# Patient Record
Sex: Female | Born: 1935 | Race: White | Hispanic: No
Health system: Southern US, Community
[De-identification: ages and names within clinical notes are randomized; demographics above are authoritative.]

## PROBLEM LIST (undated history)

## (undated) DIAGNOSIS — M1990 Unspecified osteoarthritis, unspecified site: Secondary | ICD-10-CM

## (undated) DIAGNOSIS — I10 Essential (primary) hypertension: Secondary | ICD-10-CM

## (undated) DIAGNOSIS — I351 Nonrheumatic aortic (valve) insufficiency: Secondary | ICD-10-CM

## (undated) DIAGNOSIS — R159 Full incontinence of feces: Secondary | ICD-10-CM

## (undated) DIAGNOSIS — N3281 Overactive bladder: Secondary | ICD-10-CM

## (undated) DIAGNOSIS — Z0389 Encounter for observation for other suspected diseases and conditions ruled out: Secondary | ICD-10-CM

## (undated) DIAGNOSIS — K5730 Diverticulosis of large intestine without perforation or abscess without bleeding: Secondary | ICD-10-CM

## (undated) DIAGNOSIS — F329 Major depressive disorder, single episode, unspecified: Secondary | ICD-10-CM

## (undated) DIAGNOSIS — E041 Nontoxic single thyroid nodule: Secondary | ICD-10-CM

## (undated) DIAGNOSIS — I5189 Other ill-defined heart diseases: Secondary | ICD-10-CM

## (undated) DIAGNOSIS — I420 Dilated cardiomyopathy: Secondary | ICD-10-CM

## (undated) DIAGNOSIS — E890 Postprocedural hypothyroidism: Secondary | ICD-10-CM

## (undated) DIAGNOSIS — J302 Other seasonal allergic rhinitis: Secondary | ICD-10-CM

## (undated) DIAGNOSIS — S82899A Other fracture of unspecified lower leg, initial encounter for closed fracture: Secondary | ICD-10-CM

## (undated) DIAGNOSIS — K529 Noninfective gastroenteritis and colitis, unspecified: Secondary | ICD-10-CM

## (undated) DIAGNOSIS — E785 Hyperlipidemia, unspecified: Secondary | ICD-10-CM

## (undated) DIAGNOSIS — F32A Depression, unspecified: Secondary | ICD-10-CM

## (undated) DIAGNOSIS — K219 Gastro-esophageal reflux disease without esophagitis: Secondary | ICD-10-CM

## (undated) DIAGNOSIS — R911 Solitary pulmonary nodule: Secondary | ICD-10-CM

## (undated) HISTORY — DX: Depression, unspecified: F32.A

## (undated) HISTORY — DX: Dilated cardiomyopathy: I42.0

## (undated) HISTORY — PX: TONSILLECTOMY: SUR1361

## (undated) HISTORY — DX: Solitary pulmonary nodule: R91.1

## (undated) HISTORY — DX: Other fracture of unspecified lower leg, initial encounter for closed fracture: S82.899A

## (undated) HISTORY — DX: Nonrheumatic aortic (valve) insufficiency: I35.1

## (undated) HISTORY — DX: Major depressive disorder, single episode, unspecified: F32.9

## (undated) HISTORY — PX: NASAL SINUS SURGERY: SHX719

## (undated) HISTORY — PX: OTHER SURGICAL HISTORY: SHX169

## (undated) HISTORY — DX: Hyperlipidemia, unspecified: E78.5

## (undated) HISTORY — DX: Other ill-defined heart diseases: I51.89

## (undated) HISTORY — DX: Diverticulosis of large intestine without perforation or abscess without bleeding: K57.30

## (undated) HISTORY — DX: Postprocedural hypothyroidism: E89.0

## (undated) HISTORY — PX: CARDIAC DEFIBRILLATOR PLACEMENT: SHX171

## (undated) HISTORY — DX: Other seasonal allergic rhinitis: J30.2

## (undated) HISTORY — DX: Gastro-esophageal reflux disease without esophagitis: K21.9

## (undated) HISTORY — DX: Encounter for observation for other suspected diseases and conditions ruled out: Z03.89

## (undated) HISTORY — DX: Full incontinence of feces: R15.9

## (undated) HISTORY — PX: ABDOMINAL HYSTERECTOMY: SHX81

## (undated) HISTORY — DX: Overactive bladder: N32.81

## (undated) HISTORY — DX: Nontoxic single thyroid nodule: E04.1

## (undated) HISTORY — DX: Essential (primary) hypertension: I10

## (undated) HISTORY — DX: Noninfective gastroenteritis and colitis, unspecified: K52.9

## (undated) HISTORY — DX: Unspecified osteoarthritis, unspecified site: M19.90

---
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
## 1985-06-05 HISTORY — PX: NOSE SURGERY: SHX723

## 1986-06-05 HISTORY — PX: TOTAL ABDOMINAL HYSTERECTOMY W/ BILATERAL SALPINGOOPHORECTOMY: SHX83

## 1999-08-04 HISTORY — PX: OTHER SURGICAL HISTORY: SHX169

## 2001-02-14 ENCOUNTER — Ambulatory Visit (HOSPITAL_COMMUNITY): Admission: RE | Admit: 2001-02-14 | Discharge: 2001-02-14 | Payer: Self-pay | Admitting: *Deleted

## 2001-07-05 ENCOUNTER — Encounter: Admission: RE | Admit: 2001-07-05 | Discharge: 2001-07-05 | Payer: Self-pay

## 2001-07-05 ENCOUNTER — Encounter: Payer: Self-pay | Admitting: *Deleted

## 2002-01-03 HISTORY — PX: KNEE SURGERY: SHX244

## 2004-06-05 DIAGNOSIS — IMO0001 Reserved for inherently not codable concepts without codable children: Secondary | ICD-10-CM

## 2004-06-05 HISTORY — DX: Reserved for inherently not codable concepts without codable children: IMO0001

## 2004-07-12 ENCOUNTER — Encounter: Admission: RE | Admit: 2004-07-12 | Discharge: 2004-07-12 | Payer: Self-pay | Admitting: Cardiology

## 2004-07-13 ENCOUNTER — Ambulatory Visit (HOSPITAL_COMMUNITY): Admission: RE | Admit: 2004-07-13 | Discharge: 2004-07-14 | Payer: Self-pay | Admitting: Cardiology

## 2004-10-18 ENCOUNTER — Ambulatory Visit: Payer: Self-pay | Admitting: Internal Medicine

## 2004-11-08 ENCOUNTER — Ambulatory Visit (HOSPITAL_COMMUNITY): Admission: RE | Admit: 2004-11-08 | Discharge: 2004-11-09 | Payer: Self-pay | Admitting: Cardiology

## 2004-11-08 ENCOUNTER — Ambulatory Visit: Payer: Self-pay | Admitting: Internal Medicine

## 2004-11-23 ENCOUNTER — Emergency Department (HOSPITAL_COMMUNITY): Admission: EM | Admit: 2004-11-23 | Discharge: 2004-11-24 | Payer: Self-pay | Admitting: Emergency Medicine

## 2005-02-28 ENCOUNTER — Encounter: Admission: RE | Admit: 2005-02-28 | Discharge: 2005-02-28 | Payer: Self-pay | Admitting: Cardiology

## 2005-03-13 ENCOUNTER — Inpatient Hospital Stay (HOSPITAL_COMMUNITY): Admission: RE | Admit: 2005-03-13 | Discharge: 2005-03-13 | Payer: Self-pay | Admitting: Cardiology

## 2005-08-07 ENCOUNTER — Encounter: Admission: RE | Admit: 2005-08-07 | Discharge: 2005-08-07 | Payer: Self-pay | Admitting: Family Medicine

## 2005-12-08 ENCOUNTER — Encounter (INDEPENDENT_AMBULATORY_CARE_PROVIDER_SITE_OTHER): Payer: Self-pay | Admitting: Specialist

## 2005-12-08 ENCOUNTER — Ambulatory Visit (HOSPITAL_COMMUNITY): Admission: RE | Admit: 2005-12-08 | Discharge: 2005-12-08 | Payer: Self-pay | Admitting: Gastroenterology

## 2005-12-21 ENCOUNTER — Encounter: Admission: RE | Admit: 2005-12-21 | Discharge: 2005-12-21 | Payer: Self-pay | Admitting: Gastroenterology

## 2006-01-08 ENCOUNTER — Encounter: Admission: RE | Admit: 2006-01-08 | Discharge: 2006-01-08 | Payer: Self-pay | Admitting: Gastroenterology

## 2006-02-02 ENCOUNTER — Encounter: Admission: RE | Admit: 2006-02-02 | Discharge: 2006-02-02 | Payer: Self-pay | Admitting: Family Medicine

## 2006-03-20 ENCOUNTER — Ambulatory Visit (HOSPITAL_COMMUNITY): Admission: RE | Admit: 2006-03-20 | Discharge: 2006-03-20 | Payer: Self-pay | Admitting: Gastroenterology

## 2006-03-20 ENCOUNTER — Encounter (INDEPENDENT_AMBULATORY_CARE_PROVIDER_SITE_OTHER): Payer: Self-pay | Admitting: *Deleted

## 2006-08-12 ENCOUNTER — Inpatient Hospital Stay (HOSPITAL_COMMUNITY): Admission: EM | Admit: 2006-08-12 | Discharge: 2006-08-14 | Payer: Self-pay | Admitting: Emergency Medicine

## 2006-11-14 ENCOUNTER — Ambulatory Visit (HOSPITAL_COMMUNITY): Admission: RE | Admit: 2006-11-14 | Discharge: 2006-11-14 | Payer: Self-pay | Admitting: Orthopedic Surgery

## 2007-02-03 ENCOUNTER — Inpatient Hospital Stay (HOSPITAL_COMMUNITY): Admission: EM | Admit: 2007-02-03 | Discharge: 2007-02-07 | Payer: Self-pay | Admitting: Emergency Medicine

## 2007-02-03 ENCOUNTER — Ambulatory Visit: Payer: Self-pay | Admitting: Internal Medicine

## 2007-02-06 ENCOUNTER — Ambulatory Visit: Payer: Self-pay | Admitting: Thoracic Surgery (Cardiothoracic Vascular Surgery)

## 2007-02-08 ENCOUNTER — Encounter: Admission: RE | Admit: 2007-02-08 | Discharge: 2007-02-08 | Payer: Self-pay | Admitting: Cardiology

## 2007-02-20 ENCOUNTER — Ambulatory Visit: Payer: Self-pay

## 2007-09-03 ENCOUNTER — Ambulatory Visit: Payer: Self-pay | Admitting: Internal Medicine

## 2007-09-12 ENCOUNTER — Encounter: Payer: Self-pay | Admitting: Critical Care Medicine

## 2007-09-12 ENCOUNTER — Encounter: Admission: RE | Admit: 2007-09-12 | Discharge: 2007-09-12 | Payer: Self-pay | Admitting: Cardiology

## 2007-10-03 ENCOUNTER — Other Ambulatory Visit: Admission: RE | Admit: 2007-10-03 | Discharge: 2007-10-03 | Payer: Self-pay | Admitting: Family Medicine

## 2007-10-10 DIAGNOSIS — I509 Heart failure, unspecified: Secondary | ICD-10-CM | POA: Insufficient documentation

## 2007-10-10 DIAGNOSIS — I10 Essential (primary) hypertension: Secondary | ICD-10-CM

## 2007-10-11 ENCOUNTER — Ambulatory Visit: Payer: Self-pay | Admitting: Critical Care Medicine

## 2007-10-11 DIAGNOSIS — J984 Other disorders of lung: Secondary | ICD-10-CM

## 2007-10-15 ENCOUNTER — Ambulatory Visit (HOSPITAL_COMMUNITY): Admission: RE | Admit: 2007-10-15 | Discharge: 2007-10-15 | Payer: Self-pay | Admitting: Critical Care Medicine

## 2007-10-15 DIAGNOSIS — I4891 Unspecified atrial fibrillation: Secondary | ICD-10-CM

## 2007-10-21 ENCOUNTER — Telehealth (INDEPENDENT_AMBULATORY_CARE_PROVIDER_SITE_OTHER): Payer: Self-pay | Admitting: *Deleted

## 2007-10-22 ENCOUNTER — Encounter: Payer: Self-pay | Admitting: Critical Care Medicine

## 2007-10-23 ENCOUNTER — Encounter: Payer: Self-pay | Admitting: Critical Care Medicine

## 2007-10-23 ENCOUNTER — Ambulatory Visit: Payer: Self-pay | Admitting: Critical Care Medicine

## 2007-10-23 DIAGNOSIS — J309 Allergic rhinitis, unspecified: Secondary | ICD-10-CM

## 2008-02-13 ENCOUNTER — Ambulatory Visit: Payer: Self-pay | Admitting: Cardiology

## 2008-02-14 ENCOUNTER — Encounter: Payer: Self-pay | Admitting: Critical Care Medicine

## 2008-02-17 ENCOUNTER — Telehealth (INDEPENDENT_AMBULATORY_CARE_PROVIDER_SITE_OTHER): Payer: Self-pay | Admitting: *Deleted

## 2008-05-27 ENCOUNTER — Encounter: Admission: RE | Admit: 2008-05-27 | Discharge: 2008-05-27 | Payer: Self-pay | Admitting: Gastroenterology

## 2008-10-20 ENCOUNTER — Encounter: Admission: RE | Admit: 2008-10-20 | Discharge: 2008-10-20 | Payer: Self-pay | Admitting: Gastroenterology

## 2008-10-22 ENCOUNTER — Ambulatory Visit: Payer: Self-pay | Admitting: Internal Medicine

## 2008-10-22 DIAGNOSIS — M1480 Arthropathies in other specified diseases classified elsewhere, unspecified site: Secondary | ICD-10-CM

## 2008-11-04 ENCOUNTER — Other Ambulatory Visit: Admission: RE | Admit: 2008-11-04 | Discharge: 2008-11-04 | Payer: Self-pay | Admitting: Interventional Radiology

## 2008-11-04 ENCOUNTER — Encounter (INDEPENDENT_AMBULATORY_CARE_PROVIDER_SITE_OTHER): Payer: Self-pay | Admitting: Interventional Radiology

## 2008-11-04 ENCOUNTER — Encounter: Admission: RE | Admit: 2008-11-04 | Discharge: 2008-11-04 | Payer: Self-pay | Admitting: Family Medicine

## 2009-03-17 ENCOUNTER — Telehealth (INDEPENDENT_AMBULATORY_CARE_PROVIDER_SITE_OTHER): Payer: Self-pay | Admitting: *Deleted

## 2009-03-29 ENCOUNTER — Encounter (INDEPENDENT_AMBULATORY_CARE_PROVIDER_SITE_OTHER): Payer: Self-pay | Admitting: Surgery

## 2009-03-29 ENCOUNTER — Ambulatory Visit (HOSPITAL_COMMUNITY): Admission: RE | Admit: 2009-03-29 | Discharge: 2009-03-30 | Payer: Self-pay | Admitting: Surgery

## 2009-03-29 HISTORY — PX: THYROIDECTOMY: SHX17

## 2009-04-13 ENCOUNTER — Ambulatory Visit: Payer: Self-pay | Admitting: Critical Care Medicine

## 2009-04-14 ENCOUNTER — Encounter: Payer: Self-pay | Admitting: Critical Care Medicine

## 2009-05-18 ENCOUNTER — Encounter (HOSPITAL_COMMUNITY): Admission: RE | Admit: 2009-05-18 | Discharge: 2009-06-04 | Payer: Self-pay | Admitting: Internal Medicine

## 2009-10-13 ENCOUNTER — Telehealth: Payer: Self-pay | Admitting: Critical Care Medicine

## 2009-10-14 ENCOUNTER — Encounter: Admission: RE | Admit: 2009-10-14 | Discharge: 2009-10-14 | Payer: Self-pay | Admitting: Internal Medicine

## 2009-10-19 ENCOUNTER — Ambulatory Visit: Payer: Self-pay | Admitting: Critical Care Medicine

## 2010-05-23 ENCOUNTER — Encounter (HOSPITAL_COMMUNITY)
Admission: RE | Admit: 2010-05-23 | Discharge: 2010-07-05 | Payer: Self-pay | Source: Home / Self Care | Attending: Internal Medicine | Admitting: Internal Medicine

## 2010-06-26 ENCOUNTER — Encounter: Payer: Self-pay | Admitting: Gastroenterology

## 2010-07-05 NOTE — Progress Notes (Signed)
Summary: appt for nodule  Phone Note Outgoing Call   Reason for Call: Confirm/change Appt Summary of Call: Make sure this pt has a f/u appt for pulm nodule Initial call taken by: Storm Frisk MD,  Oct 13, 2009 5:35 PM  Follow-up for Phone Call        Pt has no pending appts with PW.  Spoke with her.  Informed her she was last seen by PW 04/2009 and was told to f/u in 6 months but no has pending appts.  She is ok with scheduling f/u with PW at this time.  OV made for 10/19/09 at 330 with PW - she is aware. Follow-up by: Gweneth Dimitri RN,  Oct 15, 2009 10:13 AM

## 2010-07-05 NOTE — Assessment & Plan Note (Signed)
Summary: Pulmonary OV   Copy to:  Dr. Armanda Magic Primary Provider/Referring Provider:  Dr. Foy Guadalajara  CC:  6 month follow up for pulmonary nodule.  Pt states breathing is the same-no better or worse.  States she has a tickle in back of throat that causes dry cough.  .  History of Present Illness:        This is a 75 years old female here for pulmonary f/u.   Also had PET scan for RLL pulm nodule that is neg for uptake.  The nodule is 7mm in diameter.  Last OV 10/11/07, now here for pulmonary f/u.   April 13, 2009 4:06 PM Pt here to discuss RLL nodule. HAd PET scan 2009 neg.  Nodule only 7mm in diam and in RLL. No new symptoms. Pt is ? if needs another CT  Oct 19, 2009 3:45 PM Since last ov had thyroidectomy for CA of thyroid.  Nodule not scanned since 2009,  neg cxr at last ov in 11/10.  No change in nodule since 2007 and neg PET scan seen.  No new lung symptoms.  Pt denies any significant sore throat, nasal congestion or excess secretions, fever, chills, sweats, unintended weight loss, pleurtic or exertional chest pain, orthopnea PND, or leg swelling Pt denies any increase in rescue therapy over baseline, denies waking up needing it or having any early am or nocturnal exacerbations of coughing/wheezing/or dyspnea. Pt also denies any obvious fluctuation in symptoms wiht weather or environmental change or other alleviating or aggravating factors   Preventive Screening-Counseling & Management  Alcohol-Tobacco     Smoking Status: never     Passive Smoke Exposure: yes  Current Medications (verified): 1)  Lipitor 20 Mg  Tabs (Atorvastatin Calcium) .... Take 1 Tablet By Mouth Once A Day 2)  Ramipril 5 Mg  Caps (Ramipril) .... 15mg  Daily 3)  Coreg 6.25 Mg  Tabs (Carvedilol) .... Take 1 Tablet By Mouth Two Times A Day 4)  Adult Aspirin Ec Low Strength 81 Mg  Tbec (Aspirin) .... Take 1 Tablet By Mouth Once A Day 5)  Enablex 15 Mg  Tb24 (Darifenacin Hydrobromide) .... Take 1 Tablet By Mouth  Once A Day 6)  Multivitamins   Tabs (Multiple Vitamin) .Marland Kitchen.. 1 By Mouth Daily 7)  Vitamin C 1000 Mg Tabs (Ascorbic Acid) .... Once Daily 8)  Omega-3 350 Mg Caps (Omega-3 Fatty Acids) .... Two Times A Day 9)  Kapidex 60 Mg Cpdr (Dexlansoprazole) .Marland Kitchen.. 1 By Mouth Once Daily  * On Hold * 10)  Astelin 137 Mcg/spray Soln (Azelastine Hcl) .... 2 Sprays Each Nostril Two Times A Day As Needed 11)  Tums 500 Mg Chew (Calcium Carbonate Antacid) .... 2 Tabs Two Times A Day 12)  Synthroid 125 Mcg Tabs (Levothyroxine Sodium) .... Once Daily 13)  Klor-Con 10 10 Meq Cr-Tabs (Potassium Chloride) .... Every Other Day  Allergies (verified): No Known Drug Allergies  Past History:  Past medical, surgical, family and social histories (including risk factors) reviewed, and no changes noted (except as noted below).  Past Medical History: Current Problems:  NON-ISCHEMIC CARDIOMYOPATHY (ICD-425.4) CONGESTIVE HEART FAILURE (ICD-428.0) Hx of ATRIAL FIBRILLATION (ICD-427.31) PULMONARY NODULE (ICD-518.89) DYSPNEA (ICD-786.05) HYPERTENSION (ICD-401.9) HYPERLIPIDEMIA (ICD-272.4) G E R D (ICD-530.81) ALLERGIC RHINITIS (ICD-477.9) Thyroid Cancer     -Resection 2010     -Postop XRT  Past Surgical History: s/p defibrillator placement times two last one 9/08    Status post removal of  old lead and insertion of new lead  on February 06, 2007 under the  care of Dr. Sharrell Ku.  bilat knee surgery 2003, then 2008 ankle surgery 2008 sinus surgery 1988 T A H and B S O Tonsillectomy Thyroidectomy 2010    -Thyroid CA  Family History: Reviewed history from 10/11/2007 and no changes required. Family History Coronary Heart Disease Family History COPD  Family History Lung Cancer Family History C V A / Stroke   Social History: Reviewed history from 10/10/2008 and no changes required.  Patient is divorced.  She denies any history of tobacco  use.  She has an occasional glass of wine.  She is retired.  She lives   in a 1-story house.Passive Smoke Exposure:  yes  Review of Systems       The patient complains of shortness of breath with activity.  The patient denies shortness of breath at rest, productive cough, non-productive cough, coughing up blood, chest pain, irregular heartbeats, acid heartburn, indigestion, loss of appetite, weight change, abdominal pain, difficulty swallowing, sore throat, tooth/dental problems, headaches, nasal congestion/difficulty breathing through nose, sneezing, itching, ear ache, anxiety, depression, hand/feet swelling, joint stiffness or pain, rash, change in color of mucus, and fever.    Vital Signs:  Patient profile:   75 year old female Height:      63 inches Weight:      174 pounds BMI:     30.93 O2 Sat:      95 % on Room air Temp:     98.3 degrees F oral Pulse rate:   74 / minute BP sitting:   118 / 80  (right arm) Cuff size:   regular  Vitals Entered By: Gweneth Dimitri RN (Oct 19, 2009 3:38 PM)  O2 Flow:  Room air CC: 6 month follow up for pulmonary nodule.  Pt states breathing is the same-no better or worse.  States she has a tickle in back of throat that causes dry cough.   Comments Medications reviewed with patient Daytime contact number verified with patient. Gweneth Dimitri RN  Oct 19, 2009 3:39 PM    Physical Exam  Additional Exam:  Gen: WD WN      WF    in NAD    NCAT Heent:  no jvd, no TMG, no cervical LNademopathy, orophyx clear,  nares with clear watery drainage. Cor: RRR nl s1/s2  no s3/s4  no m r h g Abd: soft NT BSA   no masses  No HSM  no rebound or guarding Ext perfused with no c v e v.d Neuro: intact, moves all 4s, CN II-XII intact, DTRs intact Chest:  no wheezes, rales, rhonchi   no egophony  no consolidative breath sounds Skin: clear  Genital/Rectal :deferred    Impression & Recommendations:  Problem # 1:  PULMONARY NODULE (ICD-518.89) Assessment Unchanged RLL non calcified lung nodule, unchanged since 2007.  No need for  additional CT scanning . plan f/u expectantly pulm f/u is as needed no further CT scanning needed Orders: Est. Patient Level III (25956)  Medications Added to Medication List This Visit: 1)  Vitamin C 1000 Mg Tabs (Ascorbic acid) .... Once daily 2)  Kapidex 60 Mg Cpdr (Dexlansoprazole) .Marland Kitchen.. 1 by mouth once daily  * on hold * 3)  Astelin 137 Mcg/spray Soln (Azelastine hcl) .... 2 sprays each nostril two times a day as needed 4)  Klor-con 10 10 Meq Cr-tabs (Potassium chloride) .... Every other day  Complete Medication List: 1)  Lipitor 20 Mg Tabs (Atorvastatin calcium) .... Take  1 tablet by mouth once a day 2)  Ramipril 5 Mg Caps (Ramipril) .... 15mg  daily 3)  Coreg 6.25 Mg Tabs (Carvedilol) .... Take 1 tablet by mouth two times a day 4)  Adult Aspirin Ec Low Strength 81 Mg Tbec (Aspirin) .... Take 1 tablet by mouth once a day 5)  Enablex 15 Mg Tb24 (Darifenacin hydrobromide) .... Take 1 tablet by mouth once a day 6)  Multivitamins Tabs (Multiple vitamin) .Marland Kitchen.. 1 by mouth daily 7)  Vitamin C 1000 Mg Tabs (Ascorbic acid) .... Once daily 8)  Omega-3 350 Mg Caps (Omega-3 fatty acids) .... Two times a day 9)  Astelin 137 Mcg/spray Soln (Azelastine hcl) .... 2 sprays each nostril two times a day as needed 10)  Tums 500 Mg Chew (Calcium carbonate antacid) .... 2 tabs two times a day 11)  Synthroid 125 Mcg Tabs (Levothyroxine sodium) .... Once daily 12)  Klor-con 10 10 Meq Cr-tabs (Potassium chloride) .... Every other day  Patient Instructions: 1)  No further CT scans needed  2)  Follow up as needed   Immunization History:  Influenza Immunization History:    Influenza:  historical (03/05/2009)  Pneumovax Immunization History:    Pneumovax:  historical (03/05/2008)   Appended Document: Pulmonary OV fax Recardo Evangelist, Armanda Magic

## 2010-08-15 LAB — MISCELLANEOUS TEST

## 2010-08-19 ENCOUNTER — Encounter (INDEPENDENT_AMBULATORY_CARE_PROVIDER_SITE_OTHER): Payer: Self-pay | Admitting: *Deleted

## 2010-08-23 ENCOUNTER — Other Ambulatory Visit: Payer: Self-pay | Admitting: Gastroenterology

## 2010-08-23 NOTE — Letter (Signed)
Summary: Appointment - Reminder 2  [facility], Main Office  [address]   [place], [place] [address]   Phone: [phone]  Fax: [phone]     August 19, 2010 MRN: [id number]   [facility] [address] [place], [place]  [address]   Dear Ms. Ates,  Our records indicate that it is time to schedule a follow-up appointment.  Dr.Taylor recommended that you follow up with [place] in May. It is very important that we reach you to schedule this appointment. We look forward to participating in your health care needs. Please contact us at the number listed above at your earliest convenience to schedule your appointment.  If you are unable to make an appointment at this time, give [place] a call so we can update our records.     Sincerely,   Glass blower/designer

## 2010-09-08 LAB — CALCIUM
Calcium: 8.7 mg/dL (ref 8.4–10.5)
Calcium: 9.6 mg/dL (ref 8.4–10.5)

## 2010-09-08 LAB — BASIC METABOLIC PANEL
CO2: 26 mEq/L (ref 19–32)
Calcium: 9.8 mg/dL (ref 8.4–10.5)
Chloride: 109 mEq/L (ref 96–112)
GFR calc Af Amer: >60 mL/min (ref >60)
Sodium: 142 mEq/L (ref 135–145)

## 2010-09-08 LAB — CBC
Hemoglobin: 14.4 g/dL (ref 12.0–15.0)
MCHC: 34.3 g/dL (ref 30.0–36.0)
MCV: 94.6 fL (ref 78.0–100.0)
RBC: 4.45 MIL/uL (ref 3.87–5.11)
WBC: 6.1 10*3/uL (ref 4.0–10.5)

## 2010-09-08 LAB — URINE MICROSCOPIC-ADD ON

## 2010-09-08 LAB — URINALYSIS, ROUTINE W REFLEX MICROSCOPIC
Glucose, UA: NEGATIVE mg/dL
Ketones, ur: NEGATIVE mg/dL
Specific Gravity, Urine: 1.022 (ref 1.005–1.030)
pH: 7.5 (ref 5.0–8.0)

## 2010-09-08 LAB — DIFFERENTIAL
Basophils Relative: 1 % (ref 0–1)
Lymphs Abs: 1.6 10*3/uL (ref 0.7–4.0)
Monocytes Absolute: 0.7 10*3/uL (ref 0.1–1.0)
Monocytes Relative: 11 % (ref 3–12)
Neutro Abs: 3.6 10*3/uL (ref 1.7–7.7)

## 2010-10-18 ENCOUNTER — Other Ambulatory Visit: Payer: Self-pay | Admitting: Gastroenterology

## 2010-10-18 NOTE — Discharge Summary (Signed)
[name], [name]             ACCOUNT NO.:  [id number]   MEDICAL RECORD NO.:  [id number]          PATIENT TYPE:  INP   LOCATION:  3729                         FACILITY:  [facility]   PHYSICIAN:  Fransico Him, M.D.     DATE OF BIRTH:  1935-07-02   DATE OF ADMISSION:  02/03/2007  DATE OF DISCHARGE:  02/07/2007                               DISCHARGE SUMMARY   DISCHARGE DIAGNOSES:  1. Multiple defibrillations secondary to ICD lead fracture (status      post a few years back for implantation).  Status post removal of      old lead and insertion of new lead on February 06, 2007 under the      care of Dr. Crissie Sickles.  2. Dilated cardiomyopathy with normal coronaries.  3. Hypertension.  4. Gastroesophageal reflux disease.  5. Long term medication use.  6. Hyperlipidemia.   HOSPITAL COURSE:  Ms. Schexnider is a 75 year old female who was admitted  on February 03, 2007 after multiple defibrillation discharges.  She is  found to have, 3 Medtronic interrogation, thought to be due to  defibrillation lead number [phone], known to be by manufacturer.   Ultimately, the patient's lead was extracted and a new was inserted in  the operating room under the care of electrophysiology specialist, Dr.  Crissie Sickles.  The patient tolerated the procedure well and was ready for  discharge home the following day.   LABORATORY DATA:  During the patient's stay included a hemoglobin of  13.2, hematocrit 38.3, platelets 207, white count 8.1. BUN 23,  creatinine 0.75, potassium 4.1.   The patient is discharged to home in stable and improved condition.   DISCHARGE MEDICATIONS:  Vicodin 5/500 one to two tablets p.r.n. chest  pain.   The patient is to continued the current medications as follows:  1. Hydrochlorothiazide 25 mg daily.  2. Altace 15 mg a day.  3. Nexium 40 mg a day.  4. Enteric-coated aspirin 325 mg a day.  5. K-Dur 20 mEq a day.  6. Valium as needed prior to admission.  7. Lipitor 20 mg a day.  8. Enablex 15 mg a day.  9. Coreg 12.5 mg twice a day.   ACTIVITY:  Wound care as per our discharge instruction sheet.   DIET:  Remain on a low-sodium, heart-healthy diet.   FOLLOWUP:  With Dr. Theodosia Blender nurse practitioner on February 14, 2007 at  9:50 a.m.  Follow up with Dr. Radford Pax has already been arranged for  February 26, 2007 at 9 a.m.      Joesphine Bare, P.A.      Fransico Him, M.D.  Electronically Signed    LB/MEDQ  D:  02/07/2007  T:  02/07/2007  Job:  [id number]

## 2010-10-18 NOTE — Op Note (Signed)
NAME:  [name], [name]             ACCOUNT NO.:  [id number]   MEDICAL RECORD NO.:  [id number]          PATIENT TYPE:  AMB   LOCATION:  DAY                          FACILITY:  [facility]   PHYSICIAN:  Kipp Brood. Gioffre, M.D.DATE OF BIRTH:  1936/01/20   DATE OF PROCEDURE:  11/14/2006  DATE OF DISCHARGE:                               OPERATIVE REPORT   SURGEON:  Kipp Brood. Gladstone Lighter, M.D.   ASSISTANT:  Evert Kohl, P.A.   PREOPERATIVE DIAGNOSIS:  Healed bimalleolar ankle fracture on the left.   POSTOPERATIVE DIAGNOSIS:  Healed bimalleolar ankle fracture on the left.   OPERATION/PROCEDURE:  Removal of two syndesmosis screws on the left  ankle.   PROCEDURE:  Under general anesthesia, routine orthopedic prep and drape  of left lower extremity was carried out.  She had 1 gram of IV Ancef  preop.  At this time a small incision was made over the lateral aspect  of the fibula.  I utilized the mini C-arm to guide me down to the  two  screws and both screws were easily removed in the usual fashion.  Following that the wound was closed.  Sterile dressings were applied.  Note we did inject about 10 mL of 0.5% Marcaine with epinephrine into  the wound site.   FOLLOW UP:  1. She will be on crutches, full weightbearing.  2. She will see me in office Monday for wound check  3. She will be on aspirin 325 mg b.i.d.  4. When she comes in the office on Monday, we want an AP view of the      ankle.           ______________________________  Kipp Brood. Gladstone Lighter, M.D.     RAG/MEDQ  D:  11/14/2006  T:  11/14/2006  Job:  [id number]

## 2010-10-18 NOTE — Assessment & Plan Note (Signed)
[facility]                         ELECTROPHYSIOLOGY OFFICE NOTE   NAME:PRITCHARDAlisabeth, [name]                    MRN:          [id number]  DATE:02/20/2007                            DOB:          20-Mar-1936    SUBJECTIVE:  [name] was seen today in the clinic on February 20, 2007, for a wound check of her newly-implanted Medtronic model [phone]  Maximo.  The date of implant was on February 06, 2007, for  cardiomyopathy.   On interrogation of her device today her battery voltage is 3.13 with a  charge time of 7.65 seconds.  R-waves measured at 15.9 mV with a  ventricular capture threshold of 1 volt at 0.4 msec and a ventricular  lead impedance of 456 ohm's.  Shock impedance was 41 ohm's.  There were  no episodes since implant date.  This was change-out due to multiple  defibrillations, secondary to [phone] lead fracture.   She will follow up with Dr. Fransico Him from now on.  I did remove her  Steri-Strips today.  Her wound is without redness or edema.      Alma Friendly, LPN  Electronically Signed      Champ Mungo. Lovena Le, MD  Electronically Signed   PO/MedQ  DD: 02/20/2007  DT: 02/20/2007  Job #: [id number]

## 2010-10-18 NOTE — Op Note (Signed)
[name], [name]             ACCOUNT NO.:  [id number]   MEDICAL RECORD NO.:  [id number]          PATIENT TYPE:  INP   LOCATION:  3729                         FACILITY:  [facility]   PHYSICIAN:  Champ Mungo. Lovena Le, MD    DATE OF BIRTH:  [birth year]   DATE OF PROCEDURE:  02/06/2007  DATE OF DISCHARGE:                               OPERATIVE REPORT   PROCEDURE PERFORMED:  ICD lead and generator extraction followed by  implantation of a new ICD system with ICD pocket revision and  defibrillation threshold testing.   INTRODUCTION:  The patient is a 75 year old woman with a history of  nonischemic cardiomyopathy and congestive heart failure status post  prophylactic ICD implantation back in 2006 by Dr. Amalia Hailey.  At that time,  she had a Medtronic model [phone] active fixation lead placed.  She was  admitted to the hospital with six ICD discharges and subsequently found  to have ICD lead malfunction with variable pacing impedances of up to  2000 ohms and no waves on her defibrillator lead.  She is now referred  for defibrillator lead removal and insertion of a new defibrillator lead  and removal of her prior device with reinsertion.   PROCEDURE:  After informed consent was obtained, the patient was taken  to the operating room in a fasting state.  After the usual preparation  and draping, MAC was utilized for anesthesia.  An arterial line had  previously been placed.  30 mL of lidocaine was infiltrated in the left  infraclavicular region.  A 9 cm incision was carried out over this  region.  Electrocautery was utilized to dissect down to the fascial  plane.  The ICD was subsequently removed with gentle traction without  difficulty.  It was freed up from its generator and electrocautery was  utilized to peel away the dense fibrous adhesions.  At this point, a  stylet was advanced into the lead and the helix was retracted partially  back into the lead body under x-ray guidance.  At this point,  gentle  traction was placed on the lead and the lead body was counter clockwise  rotated.  Unfortunately, the lead was held fast and did not come out  from the endocardium.  At this point, the lead body was cut and a  liberator locking stylet was advanced down the course of the lead body  to the RV tip.  The liberator stylet was locked in place, a second silk  suture was placed over the distal portion of the lead insulation and  attached to the locking stylet.  At this point, the stainless steel B  sheath was advanced over the 7-French defibrillator lead and with this  traversed the dense fibrous adhesions and entered the subclavian vein.  Prior venography of the left subclavian vein demonstrated it to be  nearly occluded.  Prior attempts to obtain access into the central  circulation by puncture in the left subclavian vein resulted in  inability of a guidewire to be advanced into the atrium.  With the  stainless steel sheath into the lead body, the stainless steel sheath  was retracted and pressure was held at the lead entry into the  subclavian vein to maintain hemostasis.  The 11 [place] Cook  electrosurgical dissection sheath was advanced over the liberator stylet  and into the body of the lead and into the left subclavian vein.  Utilizing a combination of pressure, counter pressure, traction, and  counter traction with the bipolar cautery and using the outer dilator at  times, the sheath was advanced over the body of the lead into the right  ventricle.  With additional gentle traction, the lead was removed in  total.  Access was maintained by leaving the dilator in place and a  slick wire was then advanced through the dilator into the inferior vena  cava.  With access was obtained, hemostasis was subsequently obtained  and the 9 French peel away sheath was then advanced into the subclavian  vein over the Glidewire.  The Medtronic 9 [place] model [id number] active  fixation defibrillation  lead was then advanced into the right atrium.  The lead was subsequently advanced into the right ventricle and mapping  was carried out.  At the final site, the R-waves of the lead were  between 15 and 20 mV and the pacing threshold was 1 volt at 0.2  milliseconds.  10 volt pacing did not stimulate the diaphragm and there  was a large injury current noted with the lead on the ventricular  septum.  At this point, the lead was secured to the subpectoralis fascia  with a figure-of-eight silk suture and the sewing sleeve was also  secured with a silk suture.  The pocket was then irrigated with copious  amounts of kanamycin.  Electrocautery was utilized to assure hemostasis.  Of note, the pocket was modified to accommodate the defibrillator and  its new lead.  Having accomplished all this, the Medtronic Maximo VR  model 7232 single chamber defibrillator, serial number [id number], was  connected to the defibrillation lead and placed back in the subcutaneous  pocket.  The generator was secured with a silk suture.  The pocket was  irrigated with Kanamycin and defibrillation threshold testing carried  out.   After the patient was assured of deep sedation, VF was induced with a T-  wave shock and a 15 joules shock was delivered which terminated VF  restoring sinus rhythm.  At this point, no additional defibrillation  threshold testing was carried out and the incision was closed with layer  of 2-0 Vicryl followed by a layer of 3-0 Vicryl.  Benzoin was painted on  the skin, Steri-Strips were applied, and a pressure dressing was placed.  The patient was returned to the PACU in satisfactory condition.   COMPLICATIONS:  There were no immediate complications.   RESULTS:  This demonstrates successful explantation of a previously  implanted defibrillator lead and generator for which the defibrillator  lead was broken resulting in multiple ICD discharges and demonstrates  successful reimplantation of a  new Guidant defibrillator system with the  new Medtronics Sprint Quattro secure defibrillator lead being placed and  a Medtronic Maximo defibrillator which was previously connected and used  for the defibrillator.  The patient tolerated all of the above well.      Champ Mungo. Lovena Le, MD  Electronically Signed     GWT/MEDQ  D:  02/06/2007  T:  02/06/2007  Job:  [id number]   cc:   Fransico Him, M.D.  Barnett Abu, M.D.  [place] Maceo Pro, M.D.

## 2010-10-18 NOTE — Assessment & Plan Note (Signed)
[facility]                         ELECTROPHYSIOLOGY OFFICE NOTE   [name], [name]                    MRN:          [id number]  DATE:09/03/2007                            DOB:          24-Feb-1936    Ms. Curvin returns today for followup.  She is very pleasant, 75-year-  old woman with a history of nonischemic cardiomyopathy and congestive  heart failure, status post ICD insertion back in 2006, with a model [id number]  Sprint Fidelis lead placed which subsequently failed and resulted in  multiple ICD shocks.  The patient, because of chronically occluded left  subclavian vein, underwent extraction of her lead and insertion of a new  device back in September 2008.  She returns today for followup.  The  patient specifically had no complaints today except for some soreness at  her ICD insertion site where it felt like the device was resting against  the clavicle.  She had no other specific complaints.  She denies chest  pain.  She denies shortness of breath.   MEDICATIONS:  1. Nexium 40 a day.  2. Multivitamins.  3. Lipitor 20 a day.  4. Altace 15 mg daily.  5. Coreg 6.25 twice daily.  6. Potassium.  7. Glucosamine.   PHYSICAL EXAMINATION:  GENERAL:  She is a pleasant, well-appearing woman  in no acute distress.  VITAL SIGNS:  Blood pressure was 104/68, the pulse was 72 and regular,  respirations were 18.  Weight was 172 pounds.  NECK:  No jugular distention.  LUNGS:  Clear bilaterally to auscultation.  CARDIAC:  Regular rate and rhythm.  Normal S1, S2.  No murmurs, rubs or  gallops present.  ICD insertion site was healing nicely, though the  pocket of the incision was somewhat laterally displaced.  ABDOMEN:  Soft, nontender.  EXTREMITIES:  No edema.   IMPRESSION:  1. Nonischemic cardiomyopathy.  2. Congestive heart failure.  3. Status post implantable cardioverter-defibrillator insertion      complicated by lead failure resulting in lead  extraction insertion      of a new device.   DISCUSSION:  Overall, the patient is stable.  I have encouraged her that  the revving up of the defibrillator generator is not dangerous and does  not represent additional problems with the  lead.  Overall, her device is working satisfactorily and there should be  between 4-6 years of battery longevity left on it.  I will plan to see  her back in the office in several months.     Champ Mungo. Lovena Le, MD  Electronically Signed    GWT/MedQ  DD: 09/03/2007  DT: 09/04/2007  Job #: [id number]   cc:   Fransico Him, M.D.

## 2010-10-21 NOTE — Op Note (Signed)
[name], [name]             ACCOUNT NO.:  [id number]   MEDICAL RECORD NO.:  [id number]          PATIENT TYPE:  AMB   LOCATION:  ENDO                         FACILITY:  [facility]   PHYSICIAN:  Jeryl Columbia, M.D.    DATE OF BIRTH:  February 04, 1936   DATE OF PROCEDURE:  12/08/2005  DATE OF DISCHARGE:                                 OPERATIVE REPORT   PROCEDURE:  Colonoscopy with biopsy.   INDICATION:  Patient with guaiac positivity, vague abdominal pain, due for  colonic screening.  Consent was signed after risks, benefits, methods,  options thoroughly discussed in the office.   MEDICINES USED:  Fentanyl 50 mcg, Versed 5 mg.   PROCEDURE:  Rectal inspection is pertinent for external hemorrhoids, small.  Digital exam was negative.  Video pediatric adjustable colonoscope was  inserted and easily advanced around the colon to the cecum.  No position  changes or abdominal pressure was required to advance to the cecum.  On  insertion the possible mid to proximal sigmoid mass lesion was seen, with  some erythema and minimal redness but other than a occasional scattered left-  sided diverticulum, no other abnormalities.  Cecum was identified by the  appendiceal orifice and ileocecal valve.  Prep was adequate.  There was some  liquid stool that required washing and suctioning.  On slow withdrawal  through the colon,  the cecum, ascending, and transverse were normal.  A few  scattered left-sided diverticula were seen.  At between 25 and 30 cm was the  area seen on insertion.  On slow withdrawal it seemed to be more  inflammatory and localized inflammation than a true mass lesion.  Multiple  biopsies were obtained.  Again that was in the mid to proximal sigmoid.  The  scope was further withdrawn.  The distal sigmoid was normal.  Anorectal pull-  through and retroflexion confirmed some small hemorrhoids in the rectum, but  no other abnormalities.  Scope was reinserted and advanced past the above-  mentioned area to the descending colon.  Air was suctioned, scope removed.  The patient tolerated the procedure well.  There was no obvious immediate  complication.   ENDOSCOPIC DIAGNOSES:  1.  Small internal external hemorrhoids.  2.  Left-sided diverticula scattered.  3.  Erythematous area, doubt mass lesion, between 25 and 30 cm mid to      proximal sigmoid, status post biopsy.  4.  Otherwise within normal limits to the cecum.   PLAN:  Go ahead and proceed with an EGD just be sure.  Await pathology and  further workup and plans pending those findings.  See endoscopy for other  recommendations           ______________________________  Jeryl Columbia, M.D.    MEM/MEDQ  D:  12/08/2005  T:  12/08/2005  Job:  [id number]   cc:   Herbie Baltimore L. Maceo Pro, M.D.  Fax: [phone]

## 2010-10-21 NOTE — Op Note (Signed)
NAMEJALIZA, [name]             ACCOUNT NO.:  [id number]   MEDICAL RECORD NO.:  [id number]          PATIENT TYPE:  INP   LOCATION:  2899                         FACILITY:  [facility]   PHYSICIAN:  Barnett Abu, M.D.  DATE OF BIRTH:  [birth year]   DATE OF PROCEDURE:  03/13/2005  DATE OF DISCHARGE:  03/13/2005                                 OPERATIVE REPORT   PROCEDURE PERFORMED:  Defibrillation threshold testing via the device.   ATTENDING:  Barnett Abu, M.D.   INDICATION:  Carleen Rhue is 3 months status post ICD insertion for  prophylaxis under the Val-HeFT criteria for nonischemic cardiomyopathy.  She  has been noted to have a significant increase in her pacing threshold from  less than 1 volt to approximately 5 volts at 0.6 milliseconds.  The  impedance has been stable around 590 ohms.  She is brought to the  catheterization laboratory at this time to reverified the adequacy of her  defibrillation threshold settings.   PROCEDURAL NOTE:  While monitoring heart rate, blood pressure, O2 saturation  and ECG, deep conscious sedation was obtained with the administration of 6  mg of Versed and 100 mg of fentanyl.  The patient then underwent  defibrillation threshold testing.  VF was induced by shock-on-T technique.  There was prompt recognition and charge time with successful defibrillation  x2.  Initial sensitivity testing 1.2 milliseconds, follow-up sensitivity  testing at 0.9 milliseconds.  There were 2 dropouts at 1.2 and 2 dropouts at  0.9-milliseconds sensitivity.  Charge time was 3.7 and 3.02 seconds,  respectively.  Energy delivered at 20.2 joules and impedance was 49 ohms.   At the completion, the patient was administered 0.4 mg Romazicon and awoke  without difficulty.  She was transported to the recovery area in stable  condition.   IMPRESSION:  Successful defibrillation threshold testing at nominal  settings.  The patient's ability to sense ventricular fibrillatory  waves is  not impaired.      Barnett Abu, M.D.  Electronically Signed     JHE/MEDQ  D:  03/13/2005  T:  03/14/2005  Job:  [id number]

## 2010-10-21 NOTE — Op Note (Signed)
[name], [name]             ACCOUNT NO.:  [id number]   MEDICAL RECORD NO.:  [id number]          PATIENT TYPE:  AMB   LOCATION:  ENDO                         FACILITY:  [facility]   PHYSICIAN:  Jeryl Columbia, M.D.    DATE OF BIRTH:  07-04-1935   DATE OF PROCEDURE:  12/08/2005  DATE OF DISCHARGE:                                 OPERATIVE REPORT   PROCEDURE:  Esophagogastroduodenoscopy.   ENDOSCOPIST:  Jeryl Columbia, M.D.   INDICATION:  Guaiac positivity, minimal upper tract symptoms, probable  nondiagnostic colonoscopy.  Consent was signed after risks, benefits,  methods, and options were thoroughly discussed in the office before any  premeds were given.   ADDITIONAL MEDICINES FOR THIS PROCEDURE:  Fentanyl 50 mcg, Versed 2.5 mg.   PROCEDURE:  Video endoscope was inserted by direct vision.  The esophagus  was normal.  There was a tiny hiatal hernia.  Scope passed into the stomach  and advanced through the antrum, pertinent for some minimal antritis,  through a normal pylorus and into a normal duodenal bulb around the C-loop  to a normal second portion of the duodenum.  No blood was seen distally.  Scope withdrawn back to the bulb and a good look there ruled out ulcers and  malrotation.  Scope withdrawn back to the stomach and retroflexed; the  angularis, cardia, fundus, lesser and greater curve were normal on retroflex  visualization.  Straight visualization of the stomach, other than some mild  gastritis, did not reveal any additional findings.  Air was suctioned and  the scope slowly withdrawn again.  A good look at the esophagus was normal.  Scope was removed.  The patient tolerated the procedure well.  There were no  obvious immediate complications.   ENDOSCOPIC DIAGNOSES:  1.  Tiny hiatal hernia.  2.  Minimal antritis and gastritis.  3.  Otherwise normal EGD.   PLAN:  Await the colon pathology, pump inhibitors or H2 blockers p.r.n.  pending pathology.  Will see back in  the office in 2-3 weeks to recheck  symptoms, probably guaiac, and consider further workup and plan, possibly a  CAT scan.           ______________________________  Jeryl Columbia, M.D.     MEM/MEDQ  D:  12/08/2005  T:  12/08/2005  Job:  [id number]   cc:   Herbie Baltimore L. Maceo Pro, M.D.  Fax: [phone]

## 2010-10-21 NOTE — H&P (Signed)
NAMELODA, BIALAS             ACCOUNT NO.:  [id number]   MEDICAL RECORD NO.:  [id number]          PATIENT TYPE:  INP   LOCATION:  5016                         FACILITY:  [facility]   PHYSICIAN:  Kipp Brood. Gioffre, M.D.DATE OF BIRTH:  [birth year]   DATE OF ADMISSION:  08/11/2006  DATE OF DISCHARGE:                              HISTORY & PHYSICAL   CHIEF COMPLAINT:  Painful, swollen left ankle.   HISTORY OF PRESENT ILLNESS:  Patient is a 75 year old female who was at  home earlier today playing with her dogs when her dogs knocked her on  the ground, twisting her left ankle, causing severe pain and swelling.  Unable to ambulate.  Patient was able to get EMS to pick her up and  bring her to the emergency room where x-ray evaluations found that she  had a minimally displaced trimalleolar fracture of the left ankle.  Patient will be admitted for pain control and ORIF at next available  time.   ALLERGIES:  NO KNOWN DRUG ALLERGIES.   CURRENT MEDICATIONS:  1. Hydrochlorothiazide 25 mg a day.  2. Altace 15 mg a day.  3. Nexium 40 mg a day.  4. Aspirin 325 mg a day.  5. K-Dur 20 mEq a day.  6. Diazepam 15 mg a day.  7. Lipitor 20 mg a day.  8. Enablex 15 mg a day.  9. Coreg 12.5 mg p.o. b.i.d.   Past medical history includes:  1. Hypertension.  2. Hypercholesterolemia.  3. GERD.  4. Irregular heart rhythm with a cardiac pacer and defibrillator.   PRIMARY CARE PHYSICIAN:  Dr. Maceo Pro with [facility].   CARDIOLOGIST:  Dr. Radford Pax.   SOCIAL HISTORY:  Patient is divorced.  She denies any history of tobacco  use.  She has an occasional glass of wine.  She is retired.  She lives  in a 1-story house.   Review of systems is negative for any fevers, chills, illnesses.  She  denies any irregular heart rhythms recently.  No chest pains but  irregular beats.  RESPIRATORY:  She denies any symptoms of shortness of breath or chest  tightness, or pneumonias.  She denies any GU, GI,  or hematologic issues.   Family medical history is noncontributory.   PHYSICAL EXAM:  VITALS:  Temperature is 98.0.  Pulse is 70 and regular.  Respirations 18.  Blood pressure is 168/80.  GENERAL:  This is a healthy-appearing, well-developed, conscious, alert,  and appropriate female.  Appears to be fairly comfortable at this time  with her left leg in a posterior splint elevated, iced, and on an  emergency room gurney.  Head is normocephalic.  Pupils equal, round, and reactive.  Gross  hearing is intact.  Neck is supple.  No palpable lymphadenopathy.  Good range of motion.  CHEST:  Lung sounds were clear and equal bilaterally.  No wheezes,  rales, rhonchi.  She did have a prominence on the left side where  defibrillator is present.  HEART:  Regular rate and rhythm.  No murmurs, rubs, or gallops.  Abdomen is soft, nontender.  Bowel sounds present.  EXTREMITIES:  Upper extremities had good range of motion without any  difficulty.  The lower extremities:  Her left foot was already in a  posterior splint with her toes exposed.  They were pink and warm.  Good  sensation.  Neurologically, she is intact without any deficits.  X-ray shows that she has a left ankle minimally displaced trimalleolar  fracture.   PLAN:  At this time will admit Ms. Sager into the hospital for pain  control and preoperative evaluation for clearance by cardiologist so we  can do an ORIF on her ankle at next available appointment.  Dr. Theodosia Blender  office, Dr. Wynonia Lawman is in the ER and he will see her and make any  recommendations.      Evert Kohl, P.A.    ______________________________  Kipp Brood Gladstone Lighter, M.D.    RWK/MEDQ  D:  08/11/2006  T:  08/12/2006  Job:  [id number]

## 2010-10-21 NOTE — Cardiovascular Report (Signed)
NAME:  [name], [name]             ACCOUNT NO.:  [id number]   MEDICAL RECORD NO.:  [id number]          PATIENT TYPE:  OIB   LOCATION:  2899                         FACILITY:  [facility]   PHYSICIAN:  Fransico Him, M.D.     DATE OF BIRTH:  08/01/1935   DATE OF PROCEDURE:  DATE OF DISCHARGE:                              CARDIAC CATHETERIZATION   PROCEDURE:  Left heart catheterization, coronary angiography, left  ventriculography.   OPERATOR:  Fransico Him, M.D.   INDICATIONS:  Chest pain, shortness of breath.   COMPLICATIONS:  None.   IV ACCESS:  Via right femoral artery 6-French sheath.   HISTORY:  This is a 75 year old white female with no previous cardiac  history except for hypertension who has presented with occasional exertional  chest pain and dyspnea and was found to have a dilated cardiomyopathy on 2-D  echocardiogram.  Stress Cardiolite study was subsequently performed which  showed moderate to severe LV dysfunction with several perfusion defects.  She now presents for heart catheterization.   The patient was brought to the cardiac catheterization laboratory in the  fasting nonsedated state.  Informed consent was obtained.  Patient was  connected to continuous heart rate and pulse oximetry monitoring and  intermittent blood pressure monitoring.  The right groin was prepped and  draped in a sterile fashion.  One percent Xylocaine was used for local  anesthesia.  Using a modified Seldinger technique a 6-French sheath was  placed in the right femoral artery.  Under fluoroscopic guidance a 6-French  JL4 catheter was placed in the left coronary artery.  Multiple cinefilms  were taken in 30 degree RAO and 40 degree LAO views.  This catheter was then  exchanged out over guidewire for 6-French JR4 catheters and was placed under  fluoroscopic guidance in the right coronary artery.  Multiple cinefilms were  taken in the 30 degree RAO and 40 degree LAO views.  This catheter was then  exchanged out of over guidewire for a 6-French angled pigtail catheter which  was placed under fluoroscopic guidance in the left ventricular cavity.  Left  ventriculography was performed in a 30 degree RAO view using a total of 30  mL at 15 mL/sec.  Catheter was then pulled back across the aortic valve with  no significant gradient.  At the end of the procedure all catheters and  sheaths were removed.  Manual compression was performed until adequate  hemostasis was obtained.  The patient was transferred back to room in stable  condition.   RESULTS:  1.  Left main coronary artery is widely patent and bifurcates into left      anterior descending artery and left circumflex artery.  The left      anterior descending artery is widely patent throughout its course, the      apex giving rise to two large diagonal branches both of which are widely      patent.  The left circumflex is widely patent throughout its course and      traverses the AV groove giving rise to two obtuse marginal branches both  of which are widely patent.  2.  The right coronary artery is widely patent throughout its course and      distally bifurcates into posterior descending artery and posterolateral      artery both of which are widely patent.  3.  Left ventriculography shows severe LV dysfunction, EF 20% with inferior      akinesis and severe global hypokinesis.  Left ventricular pressure      131/14 mmHg, aortic pressure 127/74 mmHg, LVEDP 19-23 mmHg with moderate      MR.   ASSESSMENT:  1.  Normal coronary arteries.  2.  Idiopathic dilated cardiomyopathy, ejection fraction 20%.  3.  Moderate mitral regurgitation.   PLAN:  We will keep on telemetry bed for 23-hour observation.  Patient does  not have a ride home.  IV fluids 150 mL/hr for 6 hours.  Bed rest x6 hours.  Check an HIV, ferritin and serum protein electrophoresis for secondary  causes of dilated cardiomyopathy.  Add Coreg 3.125 mg b.i.d., change   hydrochlorothiazide to Lasix 20 mg a day and continue Altace.      TT/MEDQ  D:  07/13/2004  T:  07/13/2004  Job:  [id number]   cc:   Herbie Baltimore L. Maceo Pro, M.D.  [address] [place]  [place] [address]  Fax: [phone]   Valla Leaver. Womble, N.P.

## 2010-10-21 NOTE — Op Note (Signed)
NAME:  [name], [name]             ACCOUNT NO.:  [id number]   MEDICAL RECORD NO.:  [id number]          PATIENT TYPE:  AMB   LOCATION:  ENDO                         FACILITY:  [facility]   PHYSICIAN:  Jeryl Columbia, M.D.    DATE OF BIRTH:  February 22, 1936   DATE OF PROCEDURE:  03/20/2006  DATE OF DISCHARGE:                                 OPERATIVE REPORT   PROCEDURE:  Flexible sigmoidoscopy with biopsy.   INDICATION:  Want to recheck an abnormal sigmoid fold in a patient with  episodic diarrhea.  Consent was signed after risks, benefits, methods,  options thoroughly discussed in the office.   MEDICINES USED:  None.   PROCEDURE:  Rectal inspection is pertinent for external hemorrhoids, small.  Digital exam was negative.  The video pediatric adjustable colonoscope was  inserted and fairly easily advanced around the sigmoid, up the descending to  the splenic flexure, and then was advanced to the distal part of the  transverse, where we saw the customary triangular shape of the colon.  The  scope was then slowly withdrawn.  She had liquid stool throughout with  sigmoid diverticula confirmed.  There was minimal inflammation in the area  that seemed to be more inflamed on insertion.  The scope was slowly  withdrawn, and again the area was then the midsigmoid from 25-30 cm, and  some scattered biopsies of this area were obtained.  It was much less  inflamed and erythematous than it was before.  The scope was further  withdrawn back to the rectum.  No other abnormalities were seen.  Anorectal  pull-through and retroflexion confirmed some small hemorrhoids.  The scope  was straightened and readvanced a short way up the left side of the colon,  air was suctioned, the scope removed.  The patient tolerated the procedure  well.  There was no obvious immediate complication.   ENDOSCOPIC DIAGNOSES:  1. Internal-external hemorrhoids.  2. Left-sided diverticula and some tortuosity with minimal midsigmoid      inflammation, less than on previous colonoscopy, status post biopsy.  3. Otherwise within normal limits to the distal transverse except for some      liquid stool.   PLAN:  Will await pathology.  Consider Zyfaxin and more probiotics based on  biopsies, and then will see back to determine any other workup and plans and  have her call me sooner p.r.n.           ______________________________  Jeryl Columbia, M.D.     MEM/MEDQ  D:  03/20/2006  T:  03/21/2006  Job:  [id number]   cc:   Herbie Baltimore L. Maceo Pro, M.D.

## 2010-10-21 NOTE — Op Note (Signed)
NAMERICHANDA, DARIN             ACCOUNT NO.:  [id number]   MEDICAL RECORD NO.:  [id number]          PATIENT TYPE:  OIB   LOCATION:  2899                         FACILITY:  [facility]   PHYSICIAN:  Barnett Abu, M.D.  DATE OF BIRTH:  March 14, 1936   DATE OF PROCEDURE:  11/08/2004  DATE OF DISCHARGE:                                 OPERATIVE REPORT   PROCEDURE:  Insertion implantable cardiac defibrillator.   PROCEDURES PERFORMED:  1.  Insertion single chamber implantable cardio defibrillator.  2.  Left subclavian venogram.  3.  Defibrillation threshold testing.   INDICATION:  Gearline Spilman is a 75 year old woman with a severe  nonischemic cardiomyopathy, EF of 25-28% by recent 2-D echocardiogram. She  is brought to the cardiac catheterization laboratory for implantation of a  prophylactic ICD under the SCD-HEFT criteria.   PROCEDURE NOTE:  The patient is brought to the cardiac catheterization  laboratory in the fasting state. The left prepectoral region was prepped and  draped in the usual sterile fashion. Local anesthesia was obtained with  infiltration of 1% lidocaine with epinephrine throughout the left  prepectoral region. A 7-8 cm incision was made in the deltopectoral groove  and this was carried down by sharp dissection and electrocautery to the  prepectoral fascia. There a plane was lifted and a pocket formed inferiorly  and medially using blunt dissection and electrocautery. The pocket was then  packed with 1% kanamycin soaked gauze.   A left subclavian venogram was then performed with the peripheral injection  of 20 mL of Omnipaque. A digital cineangiogram in the AP projection was  obtained and road mapped to guide future left subclavian puncture. The  venogram did demonstrate the left subclavian vein to be widely patent and  coursing in a normal fashion over the anterior surface of the first rib and  beneath the middle third of the clavicle. There was no evidence for  persistence of the left superior vena cava. The left subclavian vein was  then punctured with an 18 gauge thin-wall needle through which was passed a  0.038 inch tight J guidewire. Over this guidewire, a 9-French tear away  sheath and dilator were advanced. The dilator was removed and the wire was  allowed to remain in place. The lead was advanced to the level of the right  atrium and the sheath was torn away. Using standard technique and  fluoroscopic landmarks, the lead was manipulated into the right ventricular  apex. There excellent pacing parameters were obtained as will be noted  below. This was an active fixation lead and the screw was advanced as  appropriate. The lead was tested for diaphragmatic pacing at 10 volts and  none was found. The lead was then sutured into place using three separate #0  silk ligatures. The kanamycin soaked gauze and remaining guidewire were then  removed from the pocket and the pocket was copiously irrigated using 1%  kanamycin solution. The leads were then attached to the pacing generator  carefully identifying each by its color and designation and inserting each  into the appropriate receptacle under the supervision of the [employer]  representative. Each lead was tightened into place and tested for security.  The leads were then wound beneath pacing shock generator and the device was  placed in the pocket. A #0 silk anchoring suture was placed. The pocket was  then inspected for bleeding and none was found. The pocket was then closed  using 2-0 Vicryl in a running fashion two layers for the subcutaneous  tissue. The skin was then approximated using 4-0 Vicryl in a running  subcuticular fashion. At this point, defibrillation threshold testings were  performed under the supervision of Dr. Virl Axe. Ventricular  fibrillation was induced by shock on T technique. There was prompt detection  and charging. Within 7 seconds, the patient delivered a 20 joules  shock at  40 ohms impedance with successful immediate conversion to sinus rhythm.  After waiting an additional five minutes, a second threshold test was  conducted, again VF was induced by shock on T technique. A 20 joules shock  was applied after detection and charging over a 7 second period. The  impedance was 45 ohms with prompt conversion to sinus rhythm. The initial  sensitivity is 1.2. The second test sensitivity was 0.09 with one drop out  on each test. It should be noted, the patient was heavily sedated with a  total of 8 mg of Versed and 125 mcg of fentanyl prior to the initiation of  defibrillation threshold testing.   PACING DATA:  The ventricular lead detected a 15.7 millivolt R wave. The  pacing threshold 0.7 volts at 0.5 milliseconds pulse width. The impedance  was 1204 ohms resulting in a current capture threshold of 0.7 MA.   EQUIPMENT DATA:  The pace shock generator is a [employer] Maximo VR model  number 7232cx, serial number [id number]. The pace shock lead is a [employer]  model number [id number], serial number [id number].      JHE/MEDQ  D:  11/08/2004  T:  11/08/2004  Job:  [id number]   cc:   Herbie Baltimore L. Maceo Pro, M.D.  [address] [place]  [place] [address]  Fax: [phone]   Deboraha Sprang, M.D.

## 2010-10-21 NOTE — Op Note (Signed)
[name], [name]             ACCOUNT NO.:  [id number]   MEDICAL RECORD NO.:  [id number]          PATIENT TYPE:  INP   LOCATION:  5016                         FACILITY:  [facility]   PHYSICIAN:  Kipp Brood. Gioffre, M.D.DATE OF BIRTH:  1936/03/11   DATE OF PROCEDURE:  08/12/2006  DATE OF DISCHARGE:                               OPERATIVE REPORT   SURGEON:  Dr. Gladstone Lighter   OPERATIONS:  Lorretta Harp PA   PREOPERATIVE DIAGNOSIS:  Displaced trimalleolar fracture left ankle.   POSTOPERATIVE DIAGNOSIS:  Displaced trimalleolar fracture left ankle.   OPERATION:  1, Open reduction internal fixation of a trimalleolar  fracture left ankle. We utilized the [employer] system.  We utilized a six-  hole tubular titanium plate to the fibula with two cancellus syndesmosis  screws.  We then utilized several other screws for fixation of fibula.  1. We then did an open reduction of the medial malleolar fracture      utilizing a 60 mm length partially threaded cancellus screw to      medial malleolus with a washer.  The screw for the medial malleolus      was 4.5 medial malleolar screw.   PROCEDURE:  Under general anesthesia, routine orthopedic prep and  draping of the left ankle was carried out.  The leg was exsanguinated,  Esmarch tourniquet was elevated at 350 mmHg.  We first approached the  posterior malleolus or lateral malleolus.  We opened the lateral  malleolus with great care taken not to injure the sensory nerves in the  area.  We identified the fracture site, reduced the fracture site,  applied a six-hole plate. First put two syndesmosis screws across the  fracture site.  Following that we utilized the rest of screws for  fixation of the plate. We had a nice anatomic reduction.  Following that  we thoroughly irrigated out the area and closed the wound layers usual  fashion.  Attention was directed to medial malleolus, we reduced the  medial malleolar fracture.  She had one large fracture and one  small  fracture of the medial malleolus.  We then inserted our guide pin across  the fracture site.  We did utilize the portable C-arm at all times  during the procedure.  We then inserted self tapping medial malleolar  screw with washer across the fracture and had excellent compression and  fixation.  Once again we utilized a C-arm to make sure we had the proper  screw length in all the screws that we applied.  Thoroughly irrigated  out the area and then closed the wound layers usual fashion.  We then  injected about 20 mL 0.5% plain Marcaine into the fracture site and  applied sterile Neosporin dressing.  We applied a bundle dressing and a  short leg cast was applied.  We let the tourniquet down prior to  wrapping the soft padding and cast.  We then will split the cast in the  recovery room.  She had 1 gram of IV Ancef preoperatively.  Postoperatively, she will be on Coumadin and we will have her up  ambulating, touch-touch weightbearing  the following day.          ______________________________  Kipp Brood. Gladstone Lighter, M.D.    RAG/MEDQ  D:  08/12/2006  T:  08/12/2006  Job:  [id number]   cc:   Fransico Him, M.D.

## 2010-10-21 NOTE — Consult Note (Signed)
NAME:  [name], [name] NO.:  [id number]   MEDICAL RECORD NO.:  [id number]          PATIENT TYPE:  EMS   LOCATION:  MAJO                         FACILITY:  [facility]   PHYSICIAN:  Broadus John, MD DATE OF BIRTH:  July 07, 1935   DATE OF CONSULTATION:  11/24/2004  DATE OF DISCHARGE:                                   CONSULTATION   REASON FOR CONSULTATION:  [name] is a 76 year old white woman who  presented to the emergency department with chest pain. Dr. Zenia Resides requested a  cardiology consultation and to assist in her management.   HISTORY OF PRESENT ILLNESS:  The patient has a nonischemic cardiomyopathy  which was diagnosed in February of this year. She has an ejection fraction  which is estimated to be 20%. A cardiac catheterization at that time  demonstrated completely normal coronary arteries. Two weeks ago, the patient  underwent implantation of a AICD for prophylactic purposes.   Today, while packing her clothes to depart on a trip, she experienced the  onset of a soreness in the left upper anterior chest. It did not radiate. It  was not associated with dyspnea, diaphoresis, or nausea. It appeared to be  exacerbated by movement of her torso and movement of her left shoulder. It  was not exacerbated by deep inspiration. It continued throughout the course  of the day and ultimately prompted her visit to the emergency department.  She was given nitroglycerin by EMS but is uncertain whether or not had an  impact on her pain. Her discomfort is still present, though has subsided  somewhat since her presentation to the emergency department. The discomfort  appears not to be related to activity or meals.   The patient has no history of myocardial infarction or arrhythmias. The  patient's only other medical problems are hypertension and dyslipidemia.   CURRENT MEDICATIONS:  1.  Altace 5 mg p.o. daily.  2.  Detrol LA 4 mg p.o. daily.  3.  Coreg 6.25 mg p.o.  b.i.d.  4.  Lipitor 20 mg p.o. daily.  5.  Furosemide 20 mg p.o. daily.   ALLERGIES:  None.   PAST SURGICAL HISTORY:  None other than the AICD implantation described  above which was an uncomplicated procedure and she has had no problems at  the implantation site.   SOCIAL HISTORY:  The patient is divorced. She has four children. She lives  alone. She does not smoke. She drinks alcohol occasionally.   FAMILY HISTORY:  Noncontributory.   REVIEW OF SYMPTOMS:  Reveals no new problems related to her head, eyes,  ears, nose, mouth, throat, lungs, gastrointestinal system, genitourinary  system, or extremities. There is no history of neurologic disorder. She does  have a history of depression. There is no history of fever, chills, or  weight loss.   PHYSICAL EXAMINATION:  VITAL SIGNS:  Blood pressure 157/94, pulse 93 and  regular, respirations 22, temperature 98.4.  GENERAL:  The patient was a middle-aged white woman in no discomfort. She  was alert, oriented, appropriate, and responsive.  HEENT:  Normal.  NECK:  Without thyromegaly or  adenopathy. Carotid pulses were palpable  bilaterally and without bruits. No jugular venous distention was noted.  CARDIOVASCULAR:  Revealed a normal S1 and S2. There was no S3 or S4. There  was just soft pansystolic murmur heard at the apex. Cardiac rhythm was  regular.  CHEST:  Palpation of the left upper anterior chest reproduced the patient's  pain exactly. Her pacemaker pocket looked clean and dry and was without  tenderness, swelling, or erythema.  LUNGS:  Clear.  ABDOMEN:  Soft and nontender. There was no mass, hepatosplenomegaly, bruit,  distention, rebound, guarding, or rigidity. Bowel sounds were normal.  BREASTS:  Not performed as they were not pertinent to the reason for acute  care hospitalization.  PELVIC:  Not performed as they were not pertinent to the reason for acute  care hospitalization.  RECTAL:  Not performed as they were not  pertinent to the reason for acute  care hospitalization.  EXTREMITIES:  Without edema, deviation, or deformity.  NEUROLOGICAL:  Unremarkable.   LABORATORY DATA:  The electrocardiogram revealed normal sinus rhythm with  occasional PAC's, left atrial enlargement, and left ventricular hypertrophy  with secondary repolarization abnormalities. The chest radiograph, according  to Dr. Zenia Resides, demonstrated no evidence of congestive heart failure or  pneumonia.   The potassium was 4.8, BUN 23, creatinine 1.1. White count was 7.3 with a  hemoglobin of 13.1 and hematocrit of 38.8. INR was 1.1. The initial set of  cardiac markers revealed a myoglobin of 37.5, CK-MB less than 1.0, and  troponin less than 0.05. The remaining studies were pending at the time of  this dictation.   IMPRESSION:  1.  Chest pain:  This is probably musculoskeletal in origin, possibly      related to the automatic implantable cardioverter/defibrillator      placement two weeks. The pain is a local soreness exacerbated by turning      the torso reproduced exactly by palpation of the chest wall. The      pacemaker site looks good. The chest pain is unlikely to represent any      acute myocardial infarction or other cardiac ischemia since the cardiac      catheterization just four months ago demonstrated completely normal      coronary arteries. A pulmonary embolus was also unlikely given the lack      of dyspnea, the lack of a pleuritic component, and the oxygen saturation      of 98% on room air.  2.  Idiopathic cardiomyopathy:  Ejection fraction is approximately 20%. Her      congestive heart failure appears to be compensated.  3.  Automatic implantable cardioverter/defibrillator (prophylactic).  4.  Hypertension.  5.  Dyslipidemia.  6.  Depression.   RECOMMENDATIONS:  1.  Complete the three sets of cardiac markers that have been initiated.  2.  Analgesia. 3.  Refer to Dr. Fransico Him in the morning for further  problems.       MSC/MEDQ  D:  11/24/2004  T:  11/24/2004  Job:  [id number]   cc:   Fransico Him, M.D.  [facility] [address] [place]  [place], [place] [address]  Fax: [phone]

## 2010-10-24 ENCOUNTER — Encounter (INDEPENDENT_AMBULATORY_CARE_PROVIDER_SITE_OTHER): Payer: Self-pay | Admitting: Surgery

## 2010-10-26 ENCOUNTER — Inpatient Hospital Stay
Admission: RE | Admit: 2010-10-26 | Discharge: 2010-10-26 | Payer: Self-pay | Source: Ambulatory Visit | Attending: Gastroenterology | Admitting: Gastroenterology

## 2010-10-26 ENCOUNTER — Ambulatory Visit
Admission: RE | Admit: 2010-10-26 | Discharge: 2010-10-26 | Disposition: A | Discharge status: Home / Self Care | Payer: Medicare Other | Source: Ambulatory Visit | Attending: Gastroenterology | Admitting: Gastroenterology

## 2010-10-26 MED ORDER — IOHEXOL 300 MG/ML  SOLN
100.0000 mL | Freq: Once | INTRAMUSCULAR | Status: AC | PRN
  Administered 2010-10-26: 100 mL via INTRAVENOUS

## 2010-11-29 ENCOUNTER — Ambulatory Visit (INDEPENDENT_AMBULATORY_CARE_PROVIDER_SITE_OTHER): Payer: Medicare Other | Admitting: Internal Medicine

## 2010-11-29 ENCOUNTER — Encounter: Payer: Self-pay | Admitting: Internal Medicine

## 2010-11-29 DIAGNOSIS — I10 Essential (primary) hypertension: Secondary | ICD-10-CM

## 2010-11-29 DIAGNOSIS — I428 Other cardiomyopathies: Secondary | ICD-10-CM

## 2010-11-29 DIAGNOSIS — I4891 Unspecified atrial fibrillation: Secondary | ICD-10-CM

## 2010-11-29 DIAGNOSIS — I509 Heart failure, unspecified: Secondary | ICD-10-CM

## 2010-11-29 NOTE — Patient Instructions (Signed)
Your physician recommends that you schedule a follow-up appointment in: 3 months with Dr Taylor  

## 2010-12-01 ENCOUNTER — Encounter: Payer: Self-pay | Admitting: Internal Medicine

## 2010-12-01 NOTE — Assessment & Plan Note (Signed)
Her symptoms are mostly well controlled. She will continue her current meds.

## 2010-12-01 NOTE — Assessment & Plan Note (Signed)
Her diastolic heart failure is fairly well controlled. She will continue a low sodium diet. I have asked her to try and lose weight.

## 2010-12-01 NOTE — Progress Notes (Addendum)
HPI [name] returns today for followup. She is a pleasant 75 yo woman with a h/o PAF, HTN, and dyslipidemia. She also has diastolic heart failure with mosly class 2 symptoms. No syncope, c/p or syncope. She has rare palpitations. No Known Allergies   Current Outpatient Prescriptions  Medication Sig Dispense Refill  . aspirin 81 MG tablet Take 81 mg by mouth daily.        Marland Kitchen atorvastatin (LIPITOR) 20 MG tablet Take 20 mg by mouth daily.        Marland Kitchen azelastine (ASTELIN) 137 MCG/SPRAY nasal spray Place 1 spray into the nose as needed. Use in each nostril as directed       . carvedilol (COREG) 12.5 MG tablet Take 12.5 mg by mouth 2 (two) times daily with a meal.        . darifenacin (ENABLEX) 15 MG 24 hr tablet Take 15 mg by mouth daily.        . fish oil-omega-3 fatty acids 1000 MG capsule Take 2 g by mouth daily.        Marland Kitchen levothyroxine (SYNTHROID) 125 MCG tablet Take 125 mcg by mouth daily.        . Multiple Vitamin (MULTIVITAMIN) capsule Take 1 capsule by mouth daily.        . ramipril (ALTACE) 10 MG capsule Take 10 mg by mouth 2 (two) times daily.       . temazepam (RESTORIL) 15 MG capsule Take 15 mg by mouth at bedtime as needed.        . vitamin C (ASCORBIC ACID) 500 MG tablet Take 500 mg by mouth daily.           Past Medical History  Diagnosis Date  . GERD (gastroesophageal reflux disease)     ROS:   All systems reviewed and negative except as noted in the HPI.   Past Surgical History  Procedure Date  . Thyroidectomy   . Broken ankle   . Tore knee   . Cardiac defibrillator placement   . Abdominal hysterectomy   . Tonsillectomy      No family history on file.   History   Social History  . Marital Status: Divorced    Spouse Name: N/A    Number of Children: N/A  . Years of Education: N/A   Occupational History  . Not on file.   Social History Main Topics  . Smoking status: Never Smoker   . Smokeless tobacco: Not on file  . Alcohol Use: Not on file  .  Drug Use: Not on file  . Sexually Active: Not on file   Other Topics Concern  . Not on file   Social History Narrative  . No narrative on file     BP 147/78  Pulse 67  Resp 16  Ht 5' 3"  (1.6 m)  Wt 174 lb (78.926 kg)  BMI 30.82 kg/m2  Physical Exam:  Obese, Well appearing NAD HEENT: Unremarkable Neck:  No JVD, no thyromegally Lymphatics:  No adenopathy Back:  No CVA tenderness Lungs:  Clear. Device tenderness is present at the lateral aspect of the incision. HEART:  Regular rate rhythm, no murmurs, no rubs, no clicks Abd:  Obese, positive bowel sounds, no organomegally, no rebound, no guarding Ext:  2 plus pulses, no edema, no cyanosis, no clubbing Skin:  No rashes no nodules Neuro:  CN II through XII intact, motor grossly intact  EKG NSR with LVH  ICD evaluation demonstrates normal device function but she  has pain and some migration of the device. Assess/Plan:

## 2010-12-01 NOTE — Assessment & Plan Note (Signed)
Her blood pressure is well controlled. She has been asked to maintain a low sodium diet.

## 2011-02-01 ENCOUNTER — Other Ambulatory Visit: Payer: Self-pay | Admitting: Gastroenterology

## 2011-02-02 ENCOUNTER — Ambulatory Visit: Payer: Medicare Other | Admitting: Cardiology

## 2011-02-16 ENCOUNTER — Encounter (INDEPENDENT_AMBULATORY_CARE_PROVIDER_SITE_OTHER): Payer: Self-pay | Admitting: Surgery

## 2011-02-20 ENCOUNTER — Ambulatory Visit (INDEPENDENT_AMBULATORY_CARE_PROVIDER_SITE_OTHER): Payer: Medicare Other | Admitting: Surgery

## 2011-02-20 ENCOUNTER — Encounter (INDEPENDENT_AMBULATORY_CARE_PROVIDER_SITE_OTHER): Payer: Self-pay | Admitting: Surgery

## 2011-02-20 VITALS — BP 142/96 | HR 60 | Temp 98.3°F | Ht 63.0 in | Wt 174.2 lb

## 2011-02-20 DIAGNOSIS — C73 Malignant neoplasm of thyroid gland: Secondary | ICD-10-CM

## 2011-02-20 NOTE — Progress Notes (Signed)
Visit Diagnoses: 1. Thyroid cancer, follicular variant papillary carcinoma, T1b, Nx, Mx     HISTORY: Patient is a 75 year old female who underwent total thyroidectomy in October 7340 for follicular variant of papillary thyroid carcinoma. Patient had a followup total body iodine scan in December 2011 which was negative for metastatic disease. Thyroglobulin levels remain undetectable. Patient is currently taking Synthroid 125 mcg daily and she returns today for her final surgical followup visit.   PERTINENT REVIEW OF SYSTEMS: Patient has had difficulty with mild dysphagia. She has had an upper endoscopy performed by her gastroenterologist. No definite etiology has been identified.   EXAM: HEENT: normocephalic; pupils equal and reactive; sclerae clear; dentition good; mucous membranes moist NECK:  Well-healed surgical incision with good cosmetic results; symmetric on extension; no palpable anterior or posterior cervical lymphadenopathy; no supraclavicular masses; no tenderness CHEST: clear to auscultation bilaterally without rales, rhonchi, or wheezes CARDIAC: regular rate and rhythm without significant murmur; peripheral pulses are full EXT:  non-tender without edema; no deformity NEURO: no gross focal deficits; no sign of tremor   IMPRESSION: Status post total thyroidectomy for follicular variant of papillary thyroid carcinoma. No evidence of recurrent disease.   PLAN: Patient will continue close follow up with her endocrinologist. She is due for repeat laboratory studies in January 2013. She will return to see me in this office as needed.   Earnstine Regal, MD, [facility]

## 2011-02-23 ENCOUNTER — Ambulatory Visit (INDEPENDENT_AMBULATORY_CARE_PROVIDER_SITE_OTHER): Payer: Medicare Other | Admitting: Internal Medicine

## 2011-02-23 ENCOUNTER — Encounter: Payer: Self-pay | Admitting: Internal Medicine

## 2011-02-23 DIAGNOSIS — I4891 Unspecified atrial fibrillation: Secondary | ICD-10-CM

## 2011-02-23 DIAGNOSIS — I10 Essential (primary) hypertension: Secondary | ICD-10-CM

## 2011-02-23 DIAGNOSIS — Z95810 Presence of automatic (implantable) cardiac defibrillator: Secondary | ICD-10-CM | POA: Insufficient documentation

## 2011-02-23 DIAGNOSIS — I428 Other cardiomyopathies: Secondary | ICD-10-CM

## 2011-02-23 NOTE — Assessment & Plan Note (Signed)
Her blood pressure is elevated somewhat today. I've asked that she reduce her salt intake, increase her physical activity, and maintain her current medical therapy. Would consider up titration of her antihypertensive medications at her next visit if her blood pressure remains elevated.

## 2011-02-23 NOTE — Progress Notes (Signed)
HPI Mrs. Bellmore returns today for followup. She is a 75 year old woman with a nonischemic cardiomyopathy, chronic class II systolic heart failure, status post ICD implantation. She developed a lead fracture on her initial device and had a revision several years ago. Since then she has been stable except for some arthritic complaints. She denies chest pain, syncope, or peripheral edema. She has class II dyspnea. She still has some residual shoulder pain. Allergies  Allergen Reactions  . Aspirin      Current Outpatient Prescriptions  Medication Sig Dispense Refill  . aspirin 81 MG tablet Take 81 mg by mouth daily.        Marland Kitchen atorvastatin (LIPITOR) 20 MG tablet Take 20 mg by mouth daily.        Marland Kitchen azelastine (ASTELIN) 137 MCG/SPRAY nasal spray Place 1 spray into the nose as needed. Use in each nostril as directed       . carvedilol (COREG) 12.5 MG tablet Take 12.5 mg by mouth 2 (two) times daily with a meal.        . darifenacin (ENABLEX) 15 MG 24 hr tablet Take 15 mg by mouth daily.        . fish oil-omega-3 fatty acids 1000 MG capsule Take 2 g by mouth daily.        Marland Kitchen levothyroxine (SYNTHROID) 125 MCG tablet Take 125 mcg by mouth daily.        . Multiple Vitamin (MULTIVITAMIN) capsule Take 1 capsule by mouth daily.        . naproxen sodium (ANAPROX) 220 MG tablet Take 220 mg by mouth 2 (two) times daily with a meal.        . ramipril (ALTACE) 10 MG capsule Take 10 mg by mouth 2 (two) times daily.       . temazepam (RESTORIL) 15 MG capsule Take 15 mg by mouth at bedtime as needed.        . vitamin C (ASCORBIC ACID) 500 MG tablet Take 500 mg by mouth daily.           Past Medical History  Diagnosis Date  . GERD (gastroesophageal reflux disease)   . Thyroid nodule     papillary cancer  . Ankle fracture   . Hypertension   . Dyslipidemia   . Seasonal allergies   . Overactive bladder   . Diverticula, colon   . Osteoarthritis     both knees  . Fecal incontinence     mild  . Depression    . Pulmonary nodule   . Post-surgical hypothyroidism   . Colitis     ROS:   All systems reviewed and negative except as noted in the HPI.   Past Surgical History  Procedure Date  . Thyroidectomy 03/29/2009    papillary cancer  . Broken ankle   . Tore knee   . Cardiac defibrillator placement   . Abdominal hysterectomy   . Tonsillectomy   . Nose surgery 1987  . Total abdominal hysterectomy w/ bilateral salpingoophorectomy 1988  . Tonsilectomy, adenoidectomy, bilateral myringotomy and tubes 1956  . Knee surgery 01/2002    both  . Left ankle surgery 08/1999     Family History  Problem Relation Age of Onset  . Emphysema Father   . Hypertension Mother   . Cancer Brother      History   Social History  . Marital Status: Divorced    Spouse Name: N/A    Number of Children: N/A  . Years of Education: N/A  Occupational History  . Not on file.   Social History Main Topics  . Smoking status: Never Smoker   . Smokeless tobacco: Not on file  . Alcohol Use: Not on file     rare  . Drug Use: Not on file  . Sexually Active: Not on file   Other Topics Concern  . Not on file   Social History Narrative  . No narrative on file     BP 154/78  Pulse 71  Ht 5' 3"  (1.6 m)  Wt 174 lb 6.4 oz (79.107 kg)  BMI 30.89 kg/m2  Physical Exam:  Well appearing 75 year old woman, NAD HEENT: Unremarkable Neck:  No JVD, no thyromegally Lymphatics:  No adenopathy Back:  No CVA tenderness Lungs:  Clear with no wheezes, rales, or rhonchi. Well-healed ICD incision. HEART:  Regular rate rhythm, no murmurs, no rubs, no clicks Abd:  soft, positive bowel sounds, no organomegally, no rebound, no guarding Ext:  2 plus pulses, no edema, no cyanosis, no clubbing Skin:  No rashes no nodules Neuro:  CN II through XII intact, motor grossly intact  DEVICE  Normal device function.  See PaceArt for details.   Assess/Plan:

## 2011-02-23 NOTE — Assessment & Plan Note (Signed)
Her device is working normally. Plan to recheck in several months.

## 2011-02-23 NOTE — Patient Instructions (Signed)
Your physician wants you to follow-up in: 12 months with Dr Knox Saliva will receive a reminder letter in the mail two months in advance. If you don't receive a letter, please call our office to schedule the follow-up appointment.  Remote monitoring is used to monitor your Pacemaker of ICD from home. This monitoring reduces the number of office visits required to check your device to one time per year. It allows [place] to keep an eye on the functioning of your device to ensure it is working properly. You are scheduled for a device check from home on 05/25/11. You may send your transmission at any time that day. If you have a wireless device, the transmission will be sent automatically. After your physician reviews your transmission, you will receive a postcard with your next transmission date.

## 2011-03-02 ENCOUNTER — Encounter: Payer: Medicare Other | Admitting: *Deleted

## 2011-03-17 LAB — I-STAT 8, (EC8 V) (CONVERTED LAB)
Acid-Base Excess: 1
Glucose, Bld: 121 — ABNORMAL HIGH
Potassium: 3.8
TCO2: 26
pCO2, Ven: 37.3 — ABNORMAL LOW
pH, Ven: 7.433 — ABNORMAL HIGH

## 2011-03-17 LAB — BASIC METABOLIC PANEL
BUN: 22
BUN: 23
CO2: 25
CO2: 28
Calcium: 9
Calcium: 9.5
Creatinine, Ser: 0.75
Creatinine, Ser: 0.82
Creatinine, Ser: 0.83
GFR calc Af Amer: >60
GFR calc non Af Amer: >60
GFR calc non Af Amer: >60
GFR calc non Af Amer: >60
Glucose, Bld: 102 — ABNORMAL HIGH
Glucose, Bld: 107 — ABNORMAL HIGH
Glucose, Bld: 129 — ABNORMAL HIGH
Sodium: 139
Sodium: 141

## 2011-03-17 LAB — POCT CARDIAC MARKERS
CKMB, poc: 11.9
Myoglobin, poc: 119
Operator id: 294521
Troponin i, poc: 0.87

## 2011-03-17 LAB — CBC
HCT: 41.4
Hemoglobin: 14.3
MCHC: 34.4
MCHC: 34.5
MCV: 90.6
Platelets: 207
Platelets: 212
Platelets: 230
RDW: 12.7
RDW: 12.7
RDW: 13
WBC: 5.7

## 2011-03-17 LAB — TYPE AND SCREEN

## 2011-03-17 LAB — DIFFERENTIAL
Basophils Absolute: 0
Basophils Relative: 0
Lymphocytes Relative: 16
Monocytes Absolute: 0.7
Neutro Abs: 6
Neutrophils Relative %: 74

## 2011-03-17 LAB — PROTIME-INR: Prothrombin Time: 13.7

## 2011-03-23 LAB — BASIC METABOLIC PANEL
CO2: 28
Calcium: 10.1
GFR calc Af Amer: >60
GFR calc non Af Amer: >60
Potassium: 4
Sodium: 143

## 2011-03-23 LAB — PROTIME-INR
INR: 1
Prothrombin Time: 13.5

## 2011-03-23 LAB — HEMOGLOBIN AND HEMATOCRIT, BLOOD: HCT: 43.3

## 2011-05-25 ENCOUNTER — Encounter: Payer: Medicare Other | Admitting: *Deleted

## 2011-06-02 ENCOUNTER — Encounter: Payer: Self-pay | Admitting: *Deleted

## 2011-06-08 ENCOUNTER — Ambulatory Visit (INDEPENDENT_AMBULATORY_CARE_PROVIDER_SITE_OTHER): Payer: Medicare Other | Admitting: *Deleted

## 2011-06-08 ENCOUNTER — Other Ambulatory Visit: Payer: Self-pay | Admitting: Internal Medicine

## 2011-06-08 ENCOUNTER — Encounter: Payer: Self-pay | Admitting: Internal Medicine

## 2011-06-08 DIAGNOSIS — I428 Other cardiomyopathies: Secondary | ICD-10-CM | POA: Diagnosis not present

## 2011-06-08 DIAGNOSIS — Z95810 Presence of automatic (implantable) cardiac defibrillator: Secondary | ICD-10-CM

## 2011-06-08 DIAGNOSIS — I4891 Unspecified atrial fibrillation: Secondary | ICD-10-CM

## 2011-06-09 ENCOUNTER — Other Ambulatory Visit: Payer: Self-pay | Admitting: Internal Medicine

## 2011-06-09 DIAGNOSIS — C73 Malignant neoplasm of thyroid gland: Secondary | ICD-10-CM

## 2011-06-10 LAB — REMOTE ICD DEVICE
BATTERY VOLTAGE: 2.9 V
RV LEAD IMPEDENCE ICD: 344 Ohm
TZAT-0001SLOWVT: 2
TZAT-0001SLOWVT: 3
TZAT-0001SLOWVT: 4
TZAT-0002SLOWVT: NEGATIVE
TZAT-0004FASTVT: 8
TZAT-0005FASTVT: 88 pct
TZAT-0011FASTVT: 10 ms
TZAT-0012FASTVT: 200 ms
TZAT-0012SLOWVT: 200 ms
TZAT-0012SLOWVT: 200 ms
TZAT-0012SLOWVT: 200 ms
TZAT-0018SLOWVT: NEGATIVE
TZAT-0018SLOWVT: NEGATIVE
TZAT-0018SLOWVT: NEGATIVE
TZAT-0018SLOWVT: NEGATIVE
TZAT-0019SLOWVT: 8 V
TZAT-0019SLOWVT: 8 V
TZAT-0019SLOWVT: 8 V
TZAT-0020FASTVT: 1.6 ms
TZAT-0020SLOWVT: 1.6 ms
TZAT-0020SLOWVT: 1.6 ms
TZON-0003FASTVT: 240 ms
TZON-0005SLOWVT: 12
TZST-0001FASTVT: 3
TZST-0001FASTVT: 4
TZST-0003FASTVT: 30 J
TZST-0003FASTVT: 35 J

## 2011-06-13 ENCOUNTER — Ambulatory Visit
Admission: RE | Admit: 2011-06-13 | Discharge: 2011-06-13 | Disposition: A | Discharge status: Home / Self Care | Payer: Medicare Other | Source: Ambulatory Visit | Attending: Internal Medicine | Admitting: Internal Medicine

## 2011-06-13 DIAGNOSIS — E890 Postprocedural hypothyroidism: Secondary | ICD-10-CM | POA: Diagnosis not present

## 2011-06-13 DIAGNOSIS — C73 Malignant neoplasm of thyroid gland: Secondary | ICD-10-CM

## 2011-06-13 DIAGNOSIS — Z85850 Personal history of malignant neoplasm of thyroid: Secondary | ICD-10-CM | POA: Diagnosis not present

## 2011-06-15 DIAGNOSIS — E890 Postprocedural hypothyroidism: Secondary | ICD-10-CM | POA: Diagnosis not present

## 2011-06-15 DIAGNOSIS — C73 Malignant neoplasm of thyroid gland: Secondary | ICD-10-CM | POA: Diagnosis not present

## 2011-06-15 DIAGNOSIS — D497 Neoplasm of unspecified behavior of endocrine glands and other parts of nervous system: Secondary | ICD-10-CM | POA: Diagnosis not present

## 2011-06-15 NOTE — Progress Notes (Signed)
Remote icd check  

## 2011-06-21 ENCOUNTER — Encounter: Payer: Self-pay | Admitting: *Deleted

## 2011-06-21 DIAGNOSIS — K921 Melena: Secondary | ICD-10-CM | POA: Diagnosis not present

## 2011-06-21 DIAGNOSIS — K5289 Other specified noninfective gastroenteritis and colitis: Secondary | ICD-10-CM | POA: Diagnosis not present

## 2011-09-07 ENCOUNTER — Ambulatory Visit (INDEPENDENT_AMBULATORY_CARE_PROVIDER_SITE_OTHER): Payer: Medicare Other | Admitting: *Deleted

## 2011-09-07 DIAGNOSIS — I4891 Unspecified atrial fibrillation: Secondary | ICD-10-CM | POA: Diagnosis not present

## 2011-09-07 DIAGNOSIS — I428 Other cardiomyopathies: Secondary | ICD-10-CM | POA: Diagnosis not present

## 2011-09-09 ENCOUNTER — Encounter: Payer: Self-pay | Admitting: Internal Medicine

## 2011-09-11 LAB — REMOTE ICD DEVICE
BATTERY VOLTAGE: 2.83 V
CHARGE TIME: 8.86 s
DEV-0020ICD: NEGATIVE
TOT-0006: 20130103000000
TZAT-0001SLOWVT: 1
TZAT-0001SLOWVT: 4
TZAT-0002SLOWVT: NEGATIVE
TZAT-0002SLOWVT: NEGATIVE
TZAT-0005FASTVT: 88 pct
TZAT-0011FASTVT: 10 ms
TZAT-0012SLOWVT: 200 ms
TZAT-0012SLOWVT: 200 ms
TZAT-0012SLOWVT: 200 ms
TZAT-0012SLOWVT: 200 ms
TZAT-0018SLOWVT: NEGATIVE
TZAT-0018SLOWVT: NEGATIVE
TZAT-0019SLOWVT: 8 V
TZAT-0019SLOWVT: 8 V
TZAT-0020SLOWVT: 1.6 ms
TZAT-0020SLOWVT: 1.6 ms
TZAT-0020SLOWVT: 1.6 ms
TZAT-0020SLOWVT: 1.6 ms
TZON-0004SLOWVT: 16
TZST-0001FASTVT: 3
TZST-0001FASTVT: 5
TZST-0001FASTVT: 6
TZST-0003FASTVT: 30 J
TZST-0003FASTVT: 35 J

## 2011-09-13 ENCOUNTER — Encounter: Payer: Self-pay | Admitting: *Deleted

## 2011-09-20 DIAGNOSIS — H2510 Age-related nuclear cataract, unspecified eye: Secondary | ICD-10-CM | POA: Diagnosis not present

## 2011-09-21 NOTE — Progress Notes (Signed)
Remote icd check  

## 2011-10-17 DIAGNOSIS — K5289 Other specified noninfective gastroenteritis and colitis: Secondary | ICD-10-CM | POA: Diagnosis not present

## 2011-10-17 DIAGNOSIS — K921 Melena: Secondary | ICD-10-CM | POA: Diagnosis not present

## 2011-10-17 DIAGNOSIS — D497 Neoplasm of unspecified behavior of endocrine glands and other parts of nervous system: Secondary | ICD-10-CM | POA: Diagnosis not present

## 2011-10-17 DIAGNOSIS — E7800 Pure hypercholesterolemia, unspecified: Secondary | ICD-10-CM | POA: Diagnosis not present

## 2011-10-17 DIAGNOSIS — R109 Unspecified abdominal pain: Secondary | ICD-10-CM | POA: Diagnosis not present

## 2011-10-17 DIAGNOSIS — Z79899 Other long term (current) drug therapy: Secondary | ICD-10-CM | POA: Diagnosis not present

## 2011-10-17 DIAGNOSIS — E890 Postprocedural hypothyroidism: Secondary | ICD-10-CM | POA: Diagnosis not present

## 2011-10-17 DIAGNOSIS — C73 Malignant neoplasm of thyroid gland: Secondary | ICD-10-CM | POA: Diagnosis not present

## 2011-11-07 DIAGNOSIS — H2510 Age-related nuclear cataract, unspecified eye: Secondary | ICD-10-CM | POA: Diagnosis not present

## 2011-11-07 DIAGNOSIS — I10 Essential (primary) hypertension: Secondary | ICD-10-CM | POA: Diagnosis not present

## 2011-11-07 DIAGNOSIS — H18419 Arcus senilis, unspecified eye: Secondary | ICD-10-CM | POA: Diagnosis not present

## 2011-11-09 DIAGNOSIS — M542 Cervicalgia: Secondary | ICD-10-CM | POA: Diagnosis not present

## 2011-11-09 DIAGNOSIS — M25519 Pain in unspecified shoulder: Secondary | ICD-10-CM | POA: Diagnosis not present

## 2011-11-20 DIAGNOSIS — Z79899 Other long term (current) drug therapy: Secondary | ICD-10-CM | POA: Diagnosis not present

## 2011-11-20 DIAGNOSIS — R079 Chest pain, unspecified: Secondary | ICD-10-CM | POA: Diagnosis not present

## 2011-11-20 DIAGNOSIS — I119 Hypertensive heart disease without heart failure: Secondary | ICD-10-CM | POA: Diagnosis not present

## 2011-11-20 DIAGNOSIS — I428 Other cardiomyopathies: Secondary | ICD-10-CM | POA: Diagnosis not present

## 2011-11-27 DIAGNOSIS — I119 Hypertensive heart disease without heart failure: Secondary | ICD-10-CM | POA: Diagnosis not present

## 2011-11-27 DIAGNOSIS — R079 Chest pain, unspecified: Secondary | ICD-10-CM | POA: Diagnosis not present

## 2011-12-13 DIAGNOSIS — C73 Malignant neoplasm of thyroid gland: Secondary | ICD-10-CM | POA: Diagnosis not present

## 2011-12-13 DIAGNOSIS — E890 Postprocedural hypothyroidism: Secondary | ICD-10-CM | POA: Diagnosis not present

## 2011-12-14 ENCOUNTER — Encounter: Payer: Medicare Other | Admitting: *Deleted

## 2011-12-15 DIAGNOSIS — C73 Malignant neoplasm of thyroid gland: Secondary | ICD-10-CM | POA: Diagnosis not present

## 2011-12-15 DIAGNOSIS — D497 Neoplasm of unspecified behavior of endocrine glands and other parts of nervous system: Secondary | ICD-10-CM | POA: Diagnosis not present

## 2011-12-15 DIAGNOSIS — R490 Dysphonia: Secondary | ICD-10-CM | POA: Diagnosis not present

## 2011-12-15 DIAGNOSIS — E890 Postprocedural hypothyroidism: Secondary | ICD-10-CM | POA: Diagnosis not present

## 2011-12-22 DIAGNOSIS — K219 Gastro-esophageal reflux disease without esophagitis: Secondary | ICD-10-CM | POA: Diagnosis not present

## 2011-12-22 DIAGNOSIS — R490 Dysphonia: Secondary | ICD-10-CM | POA: Diagnosis not present

## 2012-01-19 DIAGNOSIS — K5289 Other specified noninfective gastroenteritis and colitis: Secondary | ICD-10-CM | POA: Diagnosis not present

## 2012-01-19 DIAGNOSIS — R109 Unspecified abdominal pain: Secondary | ICD-10-CM | POA: Diagnosis not present

## 2012-01-22 ENCOUNTER — Ambulatory Visit (HOSPITAL_COMMUNITY): Admission: RE | Admit: 2012-01-22 | Payer: Medicare Other | Source: Ambulatory Visit | Admitting: Otolaryngology

## 2012-01-22 ENCOUNTER — Encounter (HOSPITAL_COMMUNITY): Admission: RE | Payer: Self-pay | Source: Ambulatory Visit

## 2012-01-22 SURGERY — MONITORING, ESOPHAGEAL PH, 24 HOUR

## 2012-02-21 ENCOUNTER — Encounter: Payer: Self-pay | Admitting: *Deleted

## 2012-03-05 ENCOUNTER — Ambulatory Visit (INDEPENDENT_AMBULATORY_CARE_PROVIDER_SITE_OTHER): Payer: Medicare Other | Admitting: Internal Medicine

## 2012-03-05 ENCOUNTER — Encounter: Payer: Self-pay | Admitting: Internal Medicine

## 2012-03-05 VITALS — BP 144/69 | HR 55 | Ht 63.0 in | Wt 164.0 lb

## 2012-03-05 DIAGNOSIS — I509 Heart failure, unspecified: Secondary | ICD-10-CM

## 2012-03-05 DIAGNOSIS — I4891 Unspecified atrial fibrillation: Secondary | ICD-10-CM | POA: Diagnosis not present

## 2012-03-05 DIAGNOSIS — Z95810 Presence of automatic (implantable) cardiac defibrillator: Secondary | ICD-10-CM | POA: Diagnosis not present

## 2012-03-05 LAB — ICD DEVICE OBSERVATION
BATTERY VOLTAGE: 2.72 V
DEV-0020ICD: NEGATIVE
RV LEAD IMPEDENCE ICD: 324 Ohm
RV LEAD THRESHOLD: 1 V
TZAT-0001SLOWVT: 1
TZAT-0001SLOWVT: 3
TZAT-0001SLOWVT: 4
TZAT-0002SLOWVT: NEGATIVE
TZAT-0002SLOWVT: NEGATIVE
TZAT-0002SLOWVT: NEGATIVE
TZAT-0002SLOWVT: NEGATIVE
TZAT-0004FASTVT: 8
TZAT-0005FASTVT: 88 pct
TZAT-0011FASTVT: 10 ms
TZAT-0012FASTVT: 200 ms
TZAT-0012SLOWVT: 200 ms
TZAT-0012SLOWVT: 200 ms
TZAT-0012SLOWVT: 200 ms
TZAT-0012SLOWVT: 200 ms
TZAT-0018SLOWVT: NEGATIVE
TZAT-0018SLOWVT: NEGATIVE
TZAT-0018SLOWVT: NEGATIVE
TZAT-0018SLOWVT: NEGATIVE
TZAT-0019SLOWVT: 8 V
TZAT-0019SLOWVT: 8 V
TZAT-0019SLOWVT: 8 V
TZAT-0019SLOWVT: 8 V
TZAT-0020SLOWVT: 1.6 ms
TZAT-0020SLOWVT: 1.6 ms
TZON-0003FASTVT: 240 ms
TZON-0005SLOWVT: 12
TZON-0011AFLUTTER: 70
TZST-0001FASTVT: 3
TZST-0001FASTVT: 6
TZST-0003FASTVT: 30 J
TZST-0003FASTVT: 35 J
TZST-0003FASTVT: 35 J

## 2012-03-05 NOTE — Patient Instructions (Addendum)
Your physician recommends that you schedule a follow-up appointment in: 3 months with device clinic-- if you get [employer] working, call and we can do a remote check instead.   Your physician wants you to follow-up in: 1 year with Dr Ladona Ridgel.  You will receive a reminder letter in the mail two months in advance. If you don't receive a letter, please call our office to schedule the follow-up appointment.

## 2012-03-05 NOTE — Progress Notes (Signed)
HPI Mrs. Menn returns today for followup. She is a pleasant76 yo woman with a h/o CHF, I suspect both systolic and diastolic, s/p ICD implant in 2008. She has class 2 symptoms. No syncope. No ICD shock. She c/o pain in her knees. She has minimal peripheral edema. Allergies  Allergen Reactions  . Aspirin      Current Outpatient Prescriptions  Medication Sig Dispense Refill  . aspirin 81 MG tablet Take 81 mg by mouth daily.        Marland Kitchen atorvastatin (LIPITOR) 20 MG tablet Take 20 mg by mouth daily.        Marland Kitchen azelastine (ASTELIN) 137 MCG/SPRAY nasal spray Place 1 spray into the nose as needed. Use in each nostril as directed       . carvedilol (COREG) 12.5 MG tablet Take 12.5 mg by mouth 2 (two) times daily with a meal.        . fish oil-omega-3 fatty acids 1000 MG capsule Take 2 g by mouth daily.        Marland Kitchen levothyroxine (SYNTHROID) 125 MCG tablet Take 125 mcg by mouth daily.        . Multiple Vitamin (MULTIVITAMIN) capsule Take 1 capsule by mouth daily.        . naproxen sodium (ANAPROX) 220 MG tablet Take 220 mg by mouth 2 (two) times daily with a meal.        . oxybutynin (DITROPAN XL) 15 MG 24 hr tablet Take 15 mg by mouth daily.      . ramipril (ALTACE) 10 MG capsule Take 10 mg by mouth 2 (two) times daily.       . temazepam (RESTORIL) 15 MG capsule Take 15 mg by mouth at bedtime as needed.        . vitamin C (ASCORBIC ACID) 500 MG tablet Take 500 mg by mouth daily.           Past Medical History  Diagnosis Date  . GERD (gastroesophageal reflux disease)   . Thyroid nodule     papillary cancer  . Ankle fracture   . Hypertension   . Dyslipidemia   . Seasonal allergies   . Overactive bladder   . Diverticula, colon   . Osteoarthritis     both knees  . Fecal incontinence     mild  . Depression   . Pulmonary nodule   . Post-surgical hypothyroidism   . Colitis     ROS:   All systems reviewed and negative except as noted in the HPI.   Past Surgical History  Procedure Date   . Thyroidectomy 03/29/2009    papillary cancer  . Broken ankle   . Tore knee   . Cardiac defibrillator placement   . Abdominal hysterectomy   . Tonsillectomy   . Nose surgery 1987  . Total abdominal hysterectomy w/ bilateral salpingoophorectomy 1988  . Tonsilectomy, adenoidectomy, bilateral myringotomy and tubes 1956  . Knee surgery 01/2002    both  . Left ankle surgery 08/1999     Family History  Problem Relation Age of Onset  . Emphysema Father   . Hypertension Mother   . Cancer Brother      History   Social History  . Marital Status: Divorced    Spouse Name: N/A    Number of Children: N/A  . Years of Education: N/A   Occupational History  . Not on file.   Social History Main Topics  . Smoking status: Never Smoker   . Smokeless  tobacco: Not on file  . Alcohol Use: Not on file     rare  . Drug Use: Not on file  . Sexually Active: Not on file   Other Topics Concern  . Not on file   Social History Narrative  . No narrative on file     BP 144/69  Pulse 55  Ht 5' 3"  (1.6 m)  Wt 164 lb (74.39 kg)  BMI 29.05 kg/m2  SpO2 100%  Physical Exam:  Well appearing 76 yo woman, NAD HEENT: Unremarkable Neck:  No JVD, no thyromegally Lungs:  Clear with no wheezes, rales, or rhonchi. Well-healed ICD incision. HEART:  Regular rate rhythm, no murmurs, no rubs, no clicks Abd:  soft, positive bowel sounds, no organomegally, no rebound, no guarding Ext:  2 plus pulses, no edema, no cyanosis, no clubbing Skin:  No rashes no nodules Neuro:  CN II through XII intact, motor grossly intact  DEVICE  Normal device function.  See PaceArt for details.   Assess/Plan:

## 2012-03-05 NOTE — Assessment & Plan Note (Signed)
She is palpitations or other symptoms. She will continue her current medical therapy.

## 2012-04-04 DIAGNOSIS — Z23 Encounter for immunization: Secondary | ICD-10-CM | POA: Diagnosis not present

## 2012-05-14 DIAGNOSIS — H2510 Age-related nuclear cataract, unspecified eye: Secondary | ICD-10-CM | POA: Diagnosis not present

## 2012-05-23 DIAGNOSIS — Z79899 Other long term (current) drug therapy: Secondary | ICD-10-CM | POA: Diagnosis not present

## 2012-05-23 DIAGNOSIS — R0602 Shortness of breath: Secondary | ICD-10-CM | POA: Diagnosis not present

## 2012-05-23 DIAGNOSIS — Z95810 Presence of automatic (implantable) cardiac defibrillator: Secondary | ICD-10-CM | POA: Diagnosis not present

## 2012-05-23 DIAGNOSIS — I428 Other cardiomyopathies: Secondary | ICD-10-CM | POA: Diagnosis not present

## 2012-05-23 DIAGNOSIS — I119 Hypertensive heart disease without heart failure: Secondary | ICD-10-CM | POA: Diagnosis not present

## 2012-05-30 DIAGNOSIS — I119 Hypertensive heart disease without heart failure: Secondary | ICD-10-CM | POA: Diagnosis not present

## 2012-05-30 DIAGNOSIS — Z79899 Other long term (current) drug therapy: Secondary | ICD-10-CM | POA: Diagnosis not present

## 2012-05-30 DIAGNOSIS — Z95810 Presence of automatic (implantable) cardiac defibrillator: Secondary | ICD-10-CM | POA: Diagnosis not present

## 2012-05-30 DIAGNOSIS — I428 Other cardiomyopathies: Secondary | ICD-10-CM | POA: Diagnosis not present

## 2012-05-30 DIAGNOSIS — R0602 Shortness of breath: Secondary | ICD-10-CM | POA: Diagnosis not present

## 2012-06-06 ENCOUNTER — Ambulatory Visit (INDEPENDENT_AMBULATORY_CARE_PROVIDER_SITE_OTHER): Payer: Medicare Other | Admitting: *Deleted

## 2012-06-06 ENCOUNTER — Encounter: Payer: Self-pay | Admitting: Internal Medicine

## 2012-06-06 DIAGNOSIS — I4891 Unspecified atrial fibrillation: Secondary | ICD-10-CM | POA: Diagnosis not present

## 2012-06-06 DIAGNOSIS — I509 Heart failure, unspecified: Secondary | ICD-10-CM | POA: Diagnosis not present

## 2012-06-06 LAB — ICD DEVICE OBSERVATION
BRDY-0002RV: 40 {beats}/min
CHARGE TIME: 9.58 s
DEV-0020ICD: NEGATIVE
FVT: 0
RV LEAD AMPLITUDE: 17.2 mv
TZAT-0001SLOWVT: 3
TZAT-0001SLOWVT: 6
TZAT-0002SLOWVT: NEGATIVE
TZAT-0002SLOWVT: NEGATIVE
TZAT-0005FASTVT: 88 pct
TZAT-0011FASTVT: 10 ms
TZAT-0012FASTVT: 200 ms
TZAT-0012SLOWVT: 200 ms
TZAT-0012SLOWVT: 200 ms
TZAT-0013FASTVT: 1
TZAT-0018FASTVT: NEGATIVE
TZAT-0018SLOWVT: NEGATIVE
TZAT-0018SLOWVT: NEGATIVE
TZAT-0018SLOWVT: NEGATIVE
TZAT-0019SLOWVT: 8 V
TZAT-0019SLOWVT: 8 V
TZAT-0019SLOWVT: 8 V
TZAT-0019SLOWVT: 8 V
TZAT-0020SLOWVT: 1.6 ms
TZAT-0020SLOWVT: 1.6 ms
TZAT-0020SLOWVT: 1.6 ms
TZAT-0020SLOWVT: 1.6 ms
TZON-0003SLOWVT: 350 ms
TZON-0004SLOWVT: 32
TZST-0001FASTVT: 3
TZST-0001FASTVT: 5
TZST-0003FASTVT: 35 J
TZST-0003FASTVT: 35 J
VENTRICULAR PACING ICD: 0 pct
VF: 0

## 2012-06-06 NOTE — Progress Notes (Signed)
defib check in clinic  

## 2012-06-17 DIAGNOSIS — H2510 Age-related nuclear cataract, unspecified eye: Secondary | ICD-10-CM | POA: Diagnosis not present

## 2012-06-17 DIAGNOSIS — H269 Unspecified cataract: Secondary | ICD-10-CM | POA: Diagnosis not present

## 2012-06-18 DIAGNOSIS — H2510 Age-related nuclear cataract, unspecified eye: Secondary | ICD-10-CM | POA: Diagnosis not present

## 2012-06-18 DIAGNOSIS — E890 Postprocedural hypothyroidism: Secondary | ICD-10-CM | POA: Diagnosis not present

## 2012-06-18 DIAGNOSIS — C73 Malignant neoplasm of thyroid gland: Secondary | ICD-10-CM | POA: Diagnosis not present

## 2012-06-21 DIAGNOSIS — E890 Postprocedural hypothyroidism: Secondary | ICD-10-CM | POA: Diagnosis not present

## 2012-06-21 DIAGNOSIS — C73 Malignant neoplasm of thyroid gland: Secondary | ICD-10-CM | POA: Diagnosis not present

## 2012-06-21 DIAGNOSIS — D497 Neoplasm of unspecified behavior of endocrine glands and other parts of nervous system: Secondary | ICD-10-CM | POA: Diagnosis not present

## 2012-06-24 DIAGNOSIS — H2510 Age-related nuclear cataract, unspecified eye: Secondary | ICD-10-CM | POA: Diagnosis not present

## 2012-07-08 DIAGNOSIS — H269 Unspecified cataract: Secondary | ICD-10-CM | POA: Diagnosis not present

## 2012-07-08 DIAGNOSIS — H2510 Age-related nuclear cataract, unspecified eye: Secondary | ICD-10-CM | POA: Diagnosis not present

## 2012-07-20 ENCOUNTER — Other Ambulatory Visit: Payer: Self-pay

## 2012-08-29 ENCOUNTER — Encounter: Payer: Self-pay | Admitting: Internal Medicine

## 2012-08-29 ENCOUNTER — Ambulatory Visit (INDEPENDENT_AMBULATORY_CARE_PROVIDER_SITE_OTHER): Payer: Medicare Other | Admitting: Internal Medicine

## 2012-08-29 VITALS — BP 128/81 | HR 78 | Ht 63.0 in | Wt 163.4 lb

## 2012-08-29 DIAGNOSIS — Z95810 Presence of automatic (implantable) cardiac defibrillator: Secondary | ICD-10-CM | POA: Diagnosis not present

## 2012-08-29 DIAGNOSIS — I4891 Unspecified atrial fibrillation: Secondary | ICD-10-CM | POA: Diagnosis not present

## 2012-08-29 DIAGNOSIS — I10 Essential (primary) hypertension: Secondary | ICD-10-CM

## 2012-08-29 LAB — ICD DEVICE OBSERVATION
DEV-0020ICD: NEGATIVE
PACEART VT: 0
RV LEAD AMPLITUDE: 17.3 mv
RV LEAD IMPEDENCE ICD: 352 Ohm
RV LEAD THRESHOLD: 1.5 V
TZAT-0001SLOWVT: 1
TZAT-0001SLOWVT: 2
TZAT-0001SLOWVT: 3
TZAT-0001SLOWVT: 6
TZAT-0002SLOWVT: NEGATIVE
TZAT-0002SLOWVT: NEGATIVE
TZAT-0002SLOWVT: NEGATIVE
TZAT-0011FASTVT: 10 ms
TZAT-0012FASTVT: 200 ms
TZAT-0012SLOWVT: 200 ms
TZAT-0012SLOWVT: 200 ms
TZAT-0012SLOWVT: 200 ms
TZAT-0013FASTVT: 1
TZAT-0018FASTVT: NEGATIVE
TZAT-0018SLOWVT: NEGATIVE
TZAT-0018SLOWVT: NEGATIVE
TZAT-0018SLOWVT: NEGATIVE
TZAT-0019FASTVT: 8 V
TZAT-0019SLOWVT: 8 V
TZAT-0019SLOWVT: 8 V
TZAT-0020FASTVT: 1.6 ms
TZAT-0020SLOWVT: 1.6 ms
TZAT-0020SLOWVT: 1.6 ms
TZAT-0020SLOWVT: 1.6 ms
TZAT-0020SLOWVT: 1.6 ms
TZON-0003FASTVT: 240 ms
TZON-0004SLOWVT: 32
TZON-0005SLOWVT: 12
TZON-0008FASTVT: 0 ms
TZON-0008SLOWVT: 0 ms
TZST-0001FASTVT: 3
TZST-0001FASTVT: 4
TZST-0001FASTVT: 6
TZST-0003FASTVT: 30 J
TZST-0003FASTVT: 35 J
TZST-0003FASTVT: 35 J

## 2012-08-29 NOTE — Assessment & Plan Note (Signed)
Her [employer] ICD is working normally. She has had no ICD therapies. She is approaching but not yet at elective replacement. My expectation is that we would peter echocardiogram to make sure that she still has an indication for ICD therapy.

## 2012-08-29 NOTE — Assessment & Plan Note (Signed)
Her blood pressure is well controlled today. She is instructed to maintain a low-sodium diet.

## 2012-08-29 NOTE — Patient Instructions (Addendum)
Your physician recommends that you schedule a follow-up appointment in: 3 months with Kristin/Paula in the device clinic.  Your physician wants you to follow-up in: 1 year with Dr. Ladona Ridgel. You will receive a reminder letter in the mail two months in advance. If you don't receive a letter, please call our office to schedule the follow-up appointment.  Your physician recommends that you continue on your current medications as directed. Please refer to the Current Medication list given to you today.

## 2012-08-29 NOTE — Progress Notes (Signed)
HPI [name] returns today after a long absence from our arrhythmia clinic. She is a very pleasant 77 year old woman who is a history of congestive heart failure, status post ICD implantation. In the interim, she has done well. She denies chest pain or shortness of breath. No peripheral edema. She hasn't had a problem in the past with migration of her device however this is no longer bothersome to her , orally she does not complain about today. She denies peripheral edema and has had no recent ICD shock. Allergies  Allergen Reactions  . Aspirin      Current Outpatient Prescriptions  Medication Sig Dispense Refill  . aspirin 81 MG tablet Take 81 mg by mouth daily.        Marland Kitchen atorvastatin (LIPITOR) 20 MG tablet Take 20 mg by mouth daily.        . carvedilol (COREG) 12.5 MG tablet Take 12.5 mg by mouth 2 (two) times daily with a meal.        . fish oil-omega-3 fatty acids 1000 MG capsule Take 2 g by mouth daily.        Marland Kitchen levothyroxine (SYNTHROID) 125 MCG tablet Take 125 mcg by mouth daily.        . Multiple Vitamin (MULTIVITAMIN) capsule Take 1 capsule by mouth daily.        . naproxen sodium (ANAPROX) 220 MG tablet Take 220 mg by mouth 2 (two) times daily with a meal.        . oxybutynin (DITROPAN XL) 15 MG 24 hr tablet Take 15 mg by mouth daily.      . ramipril (ALTACE) 10 MG capsule Take 10 mg by mouth 2 (two) times daily.       . temazepam (RESTORIL) 15 MG capsule Take 15 mg by mouth at bedtime as needed.        . vitamin C (ASCORBIC ACID) 500 MG tablet Take 500 mg by mouth daily.        Marland Kitchen azelastine (ASTELIN) 137 MCG/SPRAY nasal spray Place 1 spray into the nose as needed. Use in each nostril as directed        No current facility-administered medications for this visit.     Past Medical History  Diagnosis Date  . GERD (gastroesophageal reflux disease)   . Thyroid nodule     papillary cancer  . Ankle fracture   . Hypertension   . Dyslipidemia   . Seasonal allergies   .  Overactive bladder   . Diverticula, colon   . Osteoarthritis     both knees  . Fecal incontinence     mild  . Depression   . Pulmonary nodule   . Post-surgical hypothyroidism   . Colitis     ROS:   All systems reviewed and negative except as noted in the HPI.   Past Surgical History  Procedure Laterality Date  . Thyroidectomy  03/29/2009    papillary cancer  . Broken ankle    . Tore knee    . Cardiac defibrillator placement    . Abdominal hysterectomy    . Tonsillectomy    . Nose surgery  1987  . Total abdominal hysterectomy w/ bilateral salpingoophorectomy  1988  . Tonsilectomy, adenoidectomy, bilateral myringotomy and tubes  1956  . Knee surgery  01/2002    both  . Left ankle surgery  08/1999     Family History  Problem Relation Age of Onset  . Emphysema Father   . Hypertension Mother   .  Cancer Brother      History   Social History  . Marital Status: Divorced    Spouse Name: N/A    Number of Children: N/A  . Years of Education: N/A   Occupational History  . Not on file.   Social History Main Topics  . Smoking status: Never Smoker   . Smokeless tobacco: Not on file  . Alcohol Use: Not on file     Comment: rare  . Drug Use: Not on file  . Sexually Active: Not on file   Other Topics Concern  . Not on file   Social History Narrative  . No narrative on file     BP 128/81  Pulse 78  Ht 5' 3"  (1.6 m)  Wt 163 lb 6.4 oz (74.118 kg)  BMI 28.95 kg/m2  Physical Exam:  Well appearing 77 year old woman,NAD HEENT: Unremarkable Neck:  7 cm JVD, no thyromegally Lungs:  Clear with no wheezes, rales, or rhonchi. HEART:  Regular rate rhythm, no murmurs, no rubs, no clicks Abd:  soft, positive bowel sounds, no organomegally, no rebound, no guarding Ext:  2 plus pulses, no edema, no cyanosis, no clubbing Skin:  No rashes no nodules Neuro:  CN II through XII intact, motor grossly intact  DEVICE  Normal device function.  See PaceArt for details.    Assess/Plan:

## 2012-09-13 DIAGNOSIS — M545 Low back pain, unspecified: Secondary | ICD-10-CM | POA: Diagnosis not present

## 2012-11-21 DIAGNOSIS — I428 Other cardiomyopathies: Secondary | ICD-10-CM | POA: Diagnosis not present

## 2012-11-21 DIAGNOSIS — Z95810 Presence of automatic (implantable) cardiac defibrillator: Secondary | ICD-10-CM | POA: Diagnosis not present

## 2012-11-21 DIAGNOSIS — Z79899 Other long term (current) drug therapy: Secondary | ICD-10-CM | POA: Diagnosis not present

## 2012-11-21 DIAGNOSIS — I119 Hypertensive heart disease without heart failure: Secondary | ICD-10-CM | POA: Diagnosis not present

## 2012-12-04 ENCOUNTER — Ambulatory Visit (INDEPENDENT_AMBULATORY_CARE_PROVIDER_SITE_OTHER): Payer: Medicare Other | Admitting: *Deleted

## 2012-12-04 ENCOUNTER — Encounter: Payer: Self-pay | Admitting: Internal Medicine

## 2012-12-04 DIAGNOSIS — Z95810 Presence of automatic (implantable) cardiac defibrillator: Secondary | ICD-10-CM | POA: Diagnosis not present

## 2012-12-04 DIAGNOSIS — I509 Heart failure, unspecified: Secondary | ICD-10-CM | POA: Diagnosis not present

## 2012-12-04 DIAGNOSIS — I4891 Unspecified atrial fibrillation: Secondary | ICD-10-CM | POA: Diagnosis not present

## 2012-12-04 LAB — ICD DEVICE OBSERVATION
BATTERY VOLTAGE: 2.64 V
BRDY-0002RV: 40 {beats}/min
CHARGE TIME: 10.02 s
HV IMPEDENCE: 44 Ohm
RV LEAD IMPEDENCE ICD: 344 Ohm
TZAT-0001FASTVT: 1
TZAT-0001SLOWVT: 4
TZAT-0001SLOWVT: 5
TZAT-0002SLOWVT: NEGATIVE
TZAT-0002SLOWVT: NEGATIVE
TZAT-0002SLOWVT: NEGATIVE
TZAT-0002SLOWVT: NEGATIVE
TZAT-0004FASTVT: 8
TZAT-0012SLOWVT: 200 ms
TZAT-0012SLOWVT: 200 ms
TZAT-0012SLOWVT: 200 ms
TZAT-0012SLOWVT: 200 ms
TZAT-0012SLOWVT: 200 ms
TZAT-0013FASTVT: 1
TZAT-0018FASTVT: NEGATIVE
TZAT-0018SLOWVT: NEGATIVE
TZAT-0018SLOWVT: NEGATIVE
TZAT-0018SLOWVT: NEGATIVE
TZAT-0018SLOWVT: NEGATIVE
TZAT-0019SLOWVT: 8 V
TZAT-0019SLOWVT: 8 V
TZAT-0019SLOWVT: 8 V
TZAT-0020FASTVT: 1.6 ms
TZAT-0020SLOWVT: 1.6 ms
TZAT-0020SLOWVT: 1.6 ms
TZON-0008SLOWVT: 0 ms
TZST-0001FASTVT: 2
TZST-0001FASTVT: 4
TZST-0003FASTVT: 35 J
TZST-0003FASTVT: 35 J
VENTRICULAR PACING ICD: 0 pct

## 2012-12-04 NOTE — Progress Notes (Signed)
Defib check in clinic, device functioning normal, see PaceArt for full details.  ROV in 3 mo w/ device clinic.  Battery nearing ERI - 2.64V today (ERI=2.62V).  CZ

## 2012-12-24 ENCOUNTER — Emergency Department (HOSPITAL_COMMUNITY)
Admission: EM | Admit: 2012-12-24 | Discharge: 2012-12-24 | Disposition: A | Discharge status: Home / Self Care | Payer: Medicare Other | Attending: Emergency Medicine | Admitting: Emergency Medicine

## 2012-12-24 ENCOUNTER — Encounter (HOSPITAL_COMMUNITY): Payer: Self-pay | Admitting: *Deleted

## 2012-12-24 DIAGNOSIS — E890 Postprocedural hypothyroidism: Secondary | ICD-10-CM | POA: Diagnosis not present

## 2012-12-24 DIAGNOSIS — E041 Nontoxic single thyroid nodule: Secondary | ICD-10-CM | POA: Insufficient documentation

## 2012-12-24 DIAGNOSIS — Z7982 Long term (current) use of aspirin: Secondary | ICD-10-CM | POA: Diagnosis not present

## 2012-12-24 DIAGNOSIS — R197 Diarrhea, unspecified: Secondary | ICD-10-CM | POA: Diagnosis not present

## 2012-12-24 DIAGNOSIS — I10 Essential (primary) hypertension: Secondary | ICD-10-CM | POA: Diagnosis not present

## 2012-12-24 DIAGNOSIS — Z8781 Personal history of (healed) traumatic fracture: Secondary | ICD-10-CM | POA: Diagnosis not present

## 2012-12-24 DIAGNOSIS — IMO0002 Reserved for concepts with insufficient information to code with codable children: Secondary | ICD-10-CM | POA: Diagnosis not present

## 2012-12-24 DIAGNOSIS — Z8719 Personal history of other diseases of the digestive system: Secondary | ICD-10-CM | POA: Insufficient documentation

## 2012-12-24 DIAGNOSIS — L03111 Cellulitis of right axilla: Secondary | ICD-10-CM

## 2012-12-24 DIAGNOSIS — F329 Major depressive disorder, single episode, unspecified: Secondary | ICD-10-CM | POA: Diagnosis not present

## 2012-12-24 DIAGNOSIS — Z79899 Other long term (current) drug therapy: Secondary | ICD-10-CM | POA: Diagnosis not present

## 2012-12-24 DIAGNOSIS — L089 Local infection of the skin and subcutaneous tissue, unspecified: Secondary | ICD-10-CM | POA: Diagnosis not present

## 2012-12-24 DIAGNOSIS — E785 Hyperlipidemia, unspecified: Secondary | ICD-10-CM | POA: Diagnosis not present

## 2012-12-24 DIAGNOSIS — M1710 Unilateral primary osteoarthritis, unspecified knee: Secondary | ICD-10-CM | POA: Insufficient documentation

## 2012-12-24 DIAGNOSIS — L539 Erythematous condition, unspecified: Secondary | ICD-10-CM | POA: Diagnosis not present

## 2012-12-24 DIAGNOSIS — W57XXXA Bitten or stung by nonvenomous insect and other nonvenomous arthropods, initial encounter: Secondary | ICD-10-CM | POA: Diagnosis not present

## 2012-12-24 DIAGNOSIS — F3289 Other specified depressive episodes: Secondary | ICD-10-CM | POA: Insufficient documentation

## 2012-12-24 MED ORDER — DOXYCYCLINE HYCLATE 100 MG PO CAPS: 100.0000 mg | ORAL_CAPSULE | Freq: Two times a day (BID) | ORAL | Status: DC

## 2012-12-24 NOTE — ED Notes (Signed)
Pt is here with complaints of getting bite by something yesterday to right shoulder.  Pt reports the reddened area is painful.

## 2012-12-24 NOTE — ED Provider Notes (Signed)
History  This chart was scribed for Glade Nurse, PA-C working with Enid Skeens, MD by Greggory Stallion, ED scribe. This patient was seen in room TR04C/TR04C and the patient's care was started at 3:02 PM.  CSN: [id number] Arrival date & time 12/24/12  1401   No chief complaint on file.  HPI  HPI Comments: [name] is a 77 y.o. female who presents to the Emergency Department complaining of pain, redness, and swelling to her right axilla. She is not sure if she was bitten or not. Acute onset yesterday when she looked at the area due to a dull, constant, right posterior shoulder pain. She rates her pain 1/10 yesterday and 2/10 today. Pt states it is more sore with palpation. Pt states the area of redness has grown twice in size over the last few days. She states she was at the [facility] earlier today and was told to come to the ED. Pt took no interventions. She states she had one episode of diarrhea today. Pt denies nausea, emesis, chills, myalgias, and fever as associated symptoms.   PCP is Dr. Lovenia Kim   Past Medical History  Diagnosis Date  . GERD (gastroesophageal reflux disease)   . Thyroid nodule     papillary cancer  . Ankle fracture   . Hypertension   . Dyslipidemia   . Seasonal allergies   . Overactive bladder   . Diverticula, colon   . Osteoarthritis     both knees  . Fecal incontinence     mild  . Depression   . Pulmonary nodule   . Post-surgical hypothyroidism   . Colitis    Past Surgical History  Procedure Laterality Date  . Thyroidectomy  03/29/2009    papillary cancer  . Broken ankle    . Tore knee    . Cardiac defibrillator placement    . Abdominal hysterectomy    . Tonsillectomy    . Nose surgery  1987  . Total abdominal hysterectomy w/ bilateral salpingoophorectomy  1988  . Tonsilectomy, adenoidectomy, bilateral myringotomy and tubes  1956  . Knee surgery  01/2002    both  . Left ankle surgery  08/1999   Family History  Problem  Relation Age of Onset  . Emphysema Father   . Hypertension Mother   . Cancer Brother    History  Substance Use Topics  . Smoking status: Never Smoker   . Smokeless tobacco: Not on file  . Alcohol Use: Yes     Comment: rare   OB History   Grav Para Term Preterm Abortions TAB SAB Ect Mult Living                 Review of Systems  Constitutional: Negative for fever, chills and diaphoresis.  HENT: Negative for congestion, sore throat, neck pain and neck stiffness.   Eyes: Negative for visual disturbance.  Respiratory: Negative for apnea, chest tightness and shortness of breath.   Cardiovascular: Negative for chest pain and palpitations.  Gastrointestinal: Negative for nausea, vomiting, diarrhea and constipation.  Genitourinary: Negative for dysuria.  Musculoskeletal: Negative for myalgias, joint swelling, arthralgias and gait problem.  Skin: Positive for color change.       Increased in redness, swelling, and pain to posterior right shoulder  Neurological: Negative for dizziness, weakness, light-headedness, numbness and headaches.    Allergies  Aspirin  Home Medications   Current Outpatient Rx  Name  Route  Sig  Dispense  Refill  . aspirin 81 MG  tablet   Oral   Take 81 mg by mouth daily.           Marland Kitchen atorvastatin (LIPITOR) 20 MG tablet   Oral   Take 20 mg by mouth daily.           Marland Kitchen azelastine (ASTELIN) 137 MCG/SPRAY nasal spray   Nasal   Place 1 spray into the nose as needed. Use in each nostril as directed          . carvedilol (COREG) 12.5 MG tablet   Oral   Take 12.5 mg by mouth 2 (two) times daily with a meal.           . fish oil-omega-3 fatty acids 1000 MG capsule   Oral   Take 2 g by mouth daily.           Marland Kitchen levothyroxine (SYNTHROID) 125 MCG tablet   Oral   Take 125 mcg by mouth daily.           . mesalamine (APRISO) 0.375 G 24 hr capsule   Oral   Take 375 mg by mouth taper from 4 doses each day to 1 dose and stop.         . Multiple  Vitamin (MULTIVITAMIN) capsule   Oral   Take 1 capsule by mouth daily.           . naproxen sodium (ANAPROX) 220 MG tablet   Oral   Take 220 mg by mouth 2 (two) times daily with a meal.           . oxybutynin (DITROPAN XL) 15 MG 24 hr tablet   Oral   Take 15 mg by mouth daily.         . ramipril (ALTACE) 10 MG capsule   Oral   Take 10 mg by mouth 2 (two) times daily.          . temazepam (RESTORIL) 15 MG capsule   Oral   Take 15 mg by mouth at bedtime as needed.           . vitamin C (ASCORBIC ACID) 500 MG tablet   Oral   Take 500 mg by mouth daily.            BP 181/73  Pulse 65  Temp(Src) 98.3 F (36.8 C) (Oral)  Resp 16  SpO2 99%  Physical Exam  Nursing note and vitals reviewed. Constitutional: She is oriented to person, place, and time. She appears well-developed and well-nourished. No distress.  HENT:  Head: Normocephalic and atraumatic.  Eyes: Conjunctivae and EOM are normal.  Neck: Normal range of motion. Neck supple.  No meningeal signs  Cardiovascular: Normal rate, regular rhythm, normal heart sounds and intact distal pulses.  Exam reveals no gallop and no friction rub.   No murmur heard. Pulmonary/Chest: Effort normal and breath sounds normal. No respiratory distress. She has no wheezes. She has no rales. She exhibits no tenderness.  Abdominal: Soft. Bowel sounds are normal. She exhibits no distension. There is no tenderness. There is no rebound and no guarding.  Musculoskeletal: Normal range of motion. She exhibits no edema and no tenderness.  Neurological: She is alert and oriented to person, place, and time. No cranial nerve deficit.  Skin: Skin is warm and dry. No rash noted. She is not diaphoretic. There is erythema.  Skin at posterior right axilla is erythematous, edematous, tender and warm to touch. No bite noted. Approximately 14 cm in circumference with partial central clearing.  Marked with skin pen.  Psychiatric: She has a normal mood  and affect.    ED Course  Procedures (including critical care time)  DIAGNOSTIC STUDIES: Oxygen Saturation is 99% on RA, normal by my interpretation.    COORDINATION OF CARE: 3:15 PM-Discussed treatment plan which includes antibiotics with pt at bedside and pt agreed to plan. Advised pt to follow up with her PCP.   Labs Reviewed - No data to display No results found. 1. Cellulitis of axilla, right     MDM  Suspect uncomplicated cellulitis based on limited area of involvement, minimal pain, no systemic signs of illness (eg, fever, chills, dehydration, altered mental status, tachypnea, tachycardia, hypotension).   PE reveals redness, swelling, mildly tender, warm to touch. Skin intact, No bleeding. No bullae. Non purulent. Non circumferential.  Borders are not elevated or sharply demarcated.  Drew a line around the area of infection. Pt was instructed to return to the ED if area surpasses the boarder or pain intensifies.   Will prescribed Doxy to cover for MRSA and poss Lyme/[place]. Pt is asymptomatic, but central clearing rash. Discussed constitutional symptoms to be aware of and to follow up with her PCP in two days.   I personally performed the services described in this documentation, which was scribed in my presence. The recorded information has been reviewed and is accurate.    Glade Nurse, [place] 12/24/12 1923

## 2012-12-26 DIAGNOSIS — G479 Sleep disorder, unspecified: Secondary | ICD-10-CM | POA: Diagnosis not present

## 2012-12-26 DIAGNOSIS — IMO0002 Reserved for concepts with insufficient information to code with codable children: Secondary | ICD-10-CM | POA: Diagnosis not present

## 2012-12-26 DIAGNOSIS — Z1331 Encounter for screening for depression: Secondary | ICD-10-CM | POA: Diagnosis not present

## 2012-12-26 NOTE — ED Provider Notes (Signed)
Medical screening examination/treatment/procedure(s) were performed by non-physician practitioner and as supervising physician I was immediately available for consultation/collaboration.  Enid Skeens, MD 12/26/12 [phone]

## 2013-02-04 DIAGNOSIS — Z23 Encounter for immunization: Secondary | ICD-10-CM | POA: Diagnosis not present

## 2013-03-05 ENCOUNTER — Ambulatory Visit (INDEPENDENT_AMBULATORY_CARE_PROVIDER_SITE_OTHER): Payer: Medicare Other | Admitting: *Deleted

## 2013-03-05 DIAGNOSIS — I4891 Unspecified atrial fibrillation: Secondary | ICD-10-CM | POA: Diagnosis not present

## 2013-03-05 DIAGNOSIS — I428 Other cardiomyopathies: Secondary | ICD-10-CM

## 2013-03-05 LAB — ICD DEVICE OBSERVATION
BATTERY VOLTAGE: 2.64 V
DEV-0020ICD: NEGATIVE
PACEART VT: 0
RV LEAD AMPLITUDE: 17.5 mv
RV LEAD THRESHOLD: 1.5 V
TZAT-0001SLOWVT: 2
TZAT-0001SLOWVT: 3
TZAT-0001SLOWVT: 4
TZAT-0002SLOWVT: NEGATIVE
TZAT-0002SLOWVT: NEGATIVE
TZAT-0004FASTVT: 8
TZAT-0012FASTVT: 200 ms
TZAT-0012SLOWVT: 200 ms
TZAT-0012SLOWVT: 200 ms
TZAT-0013FASTVT: 1
TZAT-0018SLOWVT: NEGATIVE
TZAT-0018SLOWVT: NEGATIVE
TZAT-0018SLOWVT: NEGATIVE
TZAT-0019SLOWVT: 8 V
TZAT-0019SLOWVT: 8 V
TZAT-0019SLOWVT: 8 V
TZAT-0020FASTVT: 1.6 ms
TZAT-0020SLOWVT: 1.6 ms
TZAT-0020SLOWVT: 1.6 ms
TZAT-0020SLOWVT: 1.6 ms
TZON-0003FASTVT: 240 ms
TZON-0003SLOWVT: 350 ms
TZON-0004SLOWVT: 32
TZON-0005SLOWVT: 12
TZON-0008SLOWVT: 0 ms
TZON-0011AFLUTTER: 70
TZST-0001FASTVT: 4
TZST-0001FASTVT: 5
TZST-0003FASTVT: 35 J
TZST-0003FASTVT: 35 J
VENTRICULAR PACING ICD: 0 pct

## 2013-03-05 NOTE — Progress Notes (Signed)
Pacemaker check in clinic. Normal device function. Thresholds, sensing, impedances consistent with previous measurements. Device programmed to maximize longevity. No high ventricular rates noted. Device programmed at appropriate safety margins. Histogram distribution appropriate for patient activity level. Device programmed to optimize intrinsic conduction. Battery voltage 2.64 V. Patient enrolled in remote follow-up and number was given to request cell adapter. [facility] 06-06-13 and ROV in March with GT. 

## 2013-03-26 ENCOUNTER — Encounter: Payer: Self-pay | Admitting: Internal Medicine

## 2013-03-31 ENCOUNTER — Other Ambulatory Visit: Payer: Self-pay | Admitting: Cardiology

## 2013-03-31 DIAGNOSIS — M25519 Pain in unspecified shoulder: Secondary | ICD-10-CM | POA: Diagnosis not present

## 2013-03-31 DIAGNOSIS — M25569 Pain in unspecified knee: Secondary | ICD-10-CM | POA: Diagnosis not present

## 2013-04-10 ENCOUNTER — Other Ambulatory Visit: Payer: Self-pay

## 2013-05-14 DIAGNOSIS — M25569 Pain in unspecified knee: Secondary | ICD-10-CM | POA: Diagnosis not present

## 2013-05-14 DIAGNOSIS — M25519 Pain in unspecified shoulder: Secondary | ICD-10-CM | POA: Diagnosis not present

## 2013-05-23 ENCOUNTER — Ambulatory Visit (INDEPENDENT_AMBULATORY_CARE_PROVIDER_SITE_OTHER): Payer: Medicare Other | Admitting: Cardiology

## 2013-05-23 ENCOUNTER — Encounter: Payer: Self-pay | Admitting: Cardiology

## 2013-05-23 VITALS — BP 166/84 | HR 64 | Ht 63.0 in | Wt 168.4 lb

## 2013-05-23 DIAGNOSIS — I10 Essential (primary) hypertension: Secondary | ICD-10-CM | POA: Diagnosis not present

## 2013-05-23 DIAGNOSIS — I428 Other cardiomyopathies: Secondary | ICD-10-CM

## 2013-05-23 DIAGNOSIS — I5189 Other ill-defined heart diseases: Secondary | ICD-10-CM

## 2013-05-23 DIAGNOSIS — I4891 Unspecified atrial fibrillation: Secondary | ICD-10-CM | POA: Diagnosis not present

## 2013-05-23 DIAGNOSIS — R0789 Other chest pain: Secondary | ICD-10-CM | POA: Insufficient documentation

## 2013-05-23 DIAGNOSIS — I420 Dilated cardiomyopathy: Secondary | ICD-10-CM | POA: Insufficient documentation

## 2013-05-23 DIAGNOSIS — I519 Heart disease, unspecified: Secondary | ICD-10-CM

## 2013-05-23 DIAGNOSIS — K219 Gastro-esophageal reflux disease without esophagitis: Secondary | ICD-10-CM | POA: Insufficient documentation

## 2013-05-23 DIAGNOSIS — E785 Hyperlipidemia, unspecified: Secondary | ICD-10-CM | POA: Insufficient documentation

## 2013-05-23 NOTE — Patient Instructions (Addendum)
Your physician has requested that you have a lexiscan myoview. For further information please visit [email or web address] Please follow instruction sheet, as given.  Your physician recommends that you return for lab work same day as Actuary for fasting: lipid & ALT & BMP We will call you with your results   Your physician wants you to follow-up in: 6 months with DR. Turner.  You will receive a reminder letter in the mail two months in advance. If you don't receive a letter, please call our office to schedule the follow-up appointment.

## 2013-05-23 NOTE — Progress Notes (Signed)
[address], [place], [place]  [address] Phone: [phone] Fax:  [phone]  Date:  05/23/2013   ID:  [name], DOB 1935/08/06, MRN [id number]  PCP:  Beatris Si  Cardiologist:  Fransico Him, MD     History of Present Illness: [name] is a 77 y.o. female with a history of idiopathic DCM now with EF 70%, HTN and chronic SOB due to GERD who presents today for followup.  She is doing well.  She denies any LE edema, dizziness, palpitations or syncope.  She says that she has been having some chest pain.  She describes the pain as occasionally a sharp pain and then at other times a more vague pain all centered around the AICD.  She has an appt 06/2013 for AICD check.  She has chronic SOB which has been felt to be due to GERD in the past and is very stable.  She says she does not sleep well so she feels tired during the day.   Wt Readings from Last 3 Encounters:  08/29/12 163 lb 6.4 oz (74.118 kg)  03/05/12 164 lb (74.39 kg)  02/23/11 174 lb 6.4 oz (79.107 kg)     Past Medical History  Diagnosis Date  . GERD (gastroesophageal reflux disease)   . Ankle fracture   . Dyslipidemia   . Seasonal allergies   . Overactive bladder   . Diverticula, colon   . Osteoarthritis     both knees  . Fecal incontinence     mild  . Depression   . Pulmonary nodule   . Colitis   . DCM (dilated cardiomyopathy)     idiopathic now with EF 70% by echo and nuclear stress test  . Hypertension   . Thyroid nodule     papillary cancer  . Post-surgical hypothyroidism   . AICD (automatic cardioverter/defibrillator) present     Current Outpatient Prescriptions  Medication Sig Dispense Refill  . aspirin 81 MG tablet Take 81 mg by mouth daily.        Marland Kitchen atorvastatin (LIPITOR) 20 MG tablet Take 20 mg by mouth daily.        Marland Kitchen azelastine (ASTELIN) 137 MCG/SPRAY nasal spray Place 1 spray into the nose as needed. Use in each nostril as directed       . carvedilol (COREG) 25  MG tablet TAKE 1 TABLET BY MOUTH TWICE DAILY WITH FOOD  180 tablet  1  . fish oil-omega-3 fatty acids 1000 MG capsule Take 2 g by mouth daily.        Marland Kitchen levothyroxine (SYNTHROID) 125 MCG tablet Take 125 mcg by mouth daily.        . mesalamine (APRISO) 0.375 G 24 hr capsule Take 375 mg by mouth taper from 4 doses each day to 1 dose and stop.      . Multiple Vitamin (MULTIVITAMIN) capsule Take 1 capsule by mouth daily.        . naproxen sodium (ANAPROX) 220 MG tablet Take 220 mg by mouth 2 (two) times daily with a meal.        . oxybutynin (DITROPAN XL) 15 MG 24 hr tablet Take 15 mg by mouth daily.      . ramipril (ALTACE) 10 MG capsule Take 10 mg by mouth 2 (two) times daily.       . temazepam (RESTORIL) 15 MG capsule Take 15 mg by mouth at bedtime as needed.        . vitamin  C (ASCORBIC ACID) 500 MG tablet Take 500 mg by mouth daily.         No current facility-administered medications for this visit.    Allergies:    Allergies  Allergen Reactions  . Aspirin     Social History:  The patient  reports that she has never smoked. She does not have any smokeless tobacco history on file. She reports that she drinks alcohol. She reports that she does not use illicit drugs.   Family History:  The patient's family history includes Cancer in her brother; Emphysema in her father; Hypertension in her mother.   ROS:  Please see the history of present illness.      All other systems reviewed and negative.   PHYSICAL EXAM: VS:  There were no vitals taken for this visit. Well nourished, well developed, in no acute distress HEENT: normal Neck: no JVD Cardiac:  normal S1, S2; RRR; no murmur Lungs:  clear to auscultation bilaterally, no wheezing, rhonchi or rales Abd: soft, nontender, no hepatomegaly Ext: no edema Skin: warm and dry Neuro:  CNs 2-12 intact, no focal abnormalities noted  EKG:     NSR  ASSESSMENT AND PLAN:  1. Idiopathic DCM now with normalized EF 70%  2. HTN mildly elevated -  at home her BP runs around 140/4mHg  - continue Ramipril/carvedilol   - check BMET 3.  Dyslipidemia  - continue Lipitor   - check fasting lipids and ALT 4.  Atypical CP mainly over her AICD site with some sharp pains and other times a generalized discomfort over her AICD but she says that at other times the discomfort is under her breasts and is very vague in her description   - I will get her in to see Dr. TLovena Lefor evaluation.     -Leane Callto rule out ischemia  Followup with me in 6 months  Signed, TFransico Him MD 05/23/2013 11:22 AM

## 2013-06-06 ENCOUNTER — Encounter: Payer: Self-pay | Admitting: Internal Medicine

## 2013-06-06 ENCOUNTER — Encounter: Payer: Medicare Other | Admitting: *Deleted

## 2013-06-06 ENCOUNTER — Ambulatory Visit (INDEPENDENT_AMBULATORY_CARE_PROVIDER_SITE_OTHER): Payer: Medicare Other | Admitting: *Deleted

## 2013-06-06 DIAGNOSIS — I428 Other cardiomyopathies: Secondary | ICD-10-CM | POA: Diagnosis not present

## 2013-06-06 DIAGNOSIS — I420 Dilated cardiomyopathy: Secondary | ICD-10-CM

## 2013-06-06 DIAGNOSIS — I4891 Unspecified atrial fibrillation: Secondary | ICD-10-CM | POA: Diagnosis not present

## 2013-06-06 LAB — MDC_IDC_ENUM_SESS_TYPE_INCLINIC
Brady Statistic RV Percent Paced: 0 %
Date Time Interrogation Session: 20150102110806
HIGH POWER IMPEDANCE MEASURED VALUE: 45 Ohm
HIGH POWER IMPEDANCE MEASURED VALUE: 45 Ohm
HIGH POWER IMPEDANCE MEASURED VALUE: 45 Ohm
HIGH POWER IMPEDANCE MEASURED VALUE: 53 Ohm
HIGH POWER IMPEDANCE MEASURED VALUE: 53 Ohm
HIGH POWER IMPEDANCE MEASURED VALUE: 56 Ohm
HIGH POWER IMPEDANCE MEASURED VALUE: 56 Ohm
HIGH POWER IMPEDANCE MEASURED VALUE: 56 Ohm
HIGH POWER IMPEDANCE MEASURED VALUE: 57 Ohm
HighPow Impedance: 41 Ohm
HighPow Impedance: 44 Ohm
HighPow Impedance: 44 Ohm
HighPow Impedance: 45 Ohm
HighPow Impedance: 45 Ohm
HighPow Impedance: 45 Ohm
HighPow Impedance: 45 Ohm
HighPow Impedance: 45 Ohm
HighPow Impedance: 45 Ohm
HighPow Impedance: 46 Ohm
HighPow Impedance: 46 Ohm
HighPow Impedance: 46 Ohm
HighPow Impedance: 48 Ohm
HighPow Impedance: 54 Ohm
HighPow Impedance: 54 Ohm
HighPow Impedance: 55 Ohm
HighPow Impedance: 55 Ohm
HighPow Impedance: 57 Ohm
HighPow Impedance: 57 Ohm
HighPow Impedance: 57 Ohm
HighPow Impedance: 58 Ohm
HighPow Impedance: 61 Ohm
Lead Channel Impedance Value: 336 Ohm
Lead Channel Impedance Value: 336 Ohm
Lead Channel Impedance Value: 340 Ohm
Lead Channel Impedance Value: 340 Ohm
Lead Channel Impedance Value: 348 Ohm
Lead Channel Impedance Value: 348 Ohm
Lead Channel Impedance Value: 356 Ohm
Lead Channel Impedance Value: 384 Ohm
Lead Channel Sensing Intrinsic Amplitude: 14.8 mV
Lead Channel Sensing Intrinsic Amplitude: 15.9 mV
Lead Channel Sensing Intrinsic Amplitude: 16 mV
Lead Channel Sensing Intrinsic Amplitude: 16.2 mV
Lead Channel Sensing Intrinsic Amplitude: 16.5 mV
Lead Channel Sensing Intrinsic Amplitude: 16.6 mV
Lead Channel Sensing Intrinsic Amplitude: 16.9 mV
Lead Channel Sensing Intrinsic Amplitude: 17 mV
Lead Channel Sensing Intrinsic Amplitude: 17.2 mV
Lead Channel Sensing Intrinsic Amplitude: 17.3 mV
Lead Channel Sensing Intrinsic Amplitude: 17.4 mV
Lead Channel Sensing Intrinsic Amplitude: 17.8 mV
Lead Channel Sensing Intrinsic Amplitude: 17.8 mV
Lead Channel Setting Sensing Sensitivity: 0.3 mV
MDC IDC MSMT BATTERY VOLTAGE: 2.62 V
MDC IDC MSMT LEADCHNL RV IMPEDANCE VALUE: 336 Ohm
MDC IDC MSMT LEADCHNL RV IMPEDANCE VALUE: 344 Ohm
MDC IDC MSMT LEADCHNL RV IMPEDANCE VALUE: 348 Ohm
MDC IDC MSMT LEADCHNL RV IMPEDANCE VALUE: 352 Ohm
MDC IDC MSMT LEADCHNL RV IMPEDANCE VALUE: 352 Ohm
MDC IDC MSMT LEADCHNL RV IMPEDANCE VALUE: 356 Ohm
MDC IDC MSMT LEADCHNL RV IMPEDANCE VALUE: 360 Ohm
MDC IDC MSMT LEADCHNL RV SENSING INTR AMPL: 16.7 mV
MDC IDC MSMT LEADCHNL RV SENSING INTR AMPL: 17.3 mV
MDC IDC SET LEADCHNL RV PACING AMPLITUDE: 3 V
MDC IDC SET LEADCHNL RV PACING PULSEWIDTH: 0.4 ms
MDC IDC SET ZONE DETECTION INTERVAL: 300 ms
Zone Setting Detection Interval: 240 ms
Zone Setting Detection Interval: 350 ms

## 2013-06-06 NOTE — Progress Notes (Signed)
icd check in clinic for battery check.

## 2013-06-10 ENCOUNTER — Other Ambulatory Visit: Payer: Self-pay | Admitting: Internal Medicine

## 2013-06-10 DIAGNOSIS — C73 Malignant neoplasm of thyroid gland: Secondary | ICD-10-CM

## 2013-06-11 ENCOUNTER — Other Ambulatory Visit (INDEPENDENT_AMBULATORY_CARE_PROVIDER_SITE_OTHER): Payer: Medicare Other

## 2013-06-11 ENCOUNTER — Ambulatory Visit (HOSPITAL_COMMUNITY): Payer: Medicare Other | Attending: Cardiology | Admitting: Radiology

## 2013-06-11 VITALS — BP 155/95 | HR 59 | Ht 63.0 in | Wt 170.0 lb

## 2013-06-11 DIAGNOSIS — I428 Other cardiomyopathies: Secondary | ICD-10-CM

## 2013-06-11 DIAGNOSIS — R079 Chest pain, unspecified: Secondary | ICD-10-CM | POA: Diagnosis not present

## 2013-06-11 DIAGNOSIS — R0602 Shortness of breath: Secondary | ICD-10-CM

## 2013-06-11 DIAGNOSIS — E785 Hyperlipidemia, unspecified: Secondary | ICD-10-CM | POA: Diagnosis not present

## 2013-06-11 DIAGNOSIS — I420 Dilated cardiomyopathy: Secondary | ICD-10-CM

## 2013-06-11 DIAGNOSIS — R0789 Other chest pain: Secondary | ICD-10-CM | POA: Insufficient documentation

## 2013-06-11 LAB — LIPID PANEL
CHOLESTEROL: 165 mg/dL (ref 0–200)
HDL: 53.2 mg/dL (ref >39.00)
LDL Cholesterol: 100 mg/dL — ABNORMAL HIGH (ref 0–99)
TRIGLYCERIDES: 61 mg/dL (ref 0.0–149.0)
Total CHOL/HDL Ratio: 3
VLDL: 12.2 mg/dL (ref 0.0–40.0)

## 2013-06-11 LAB — BASIC METABOLIC PANEL
BUN: 29 mg/dL — ABNORMAL HIGH (ref 6–23)
CALCIUM: 8.9 mg/dL (ref 8.4–10.5)
CO2: 23 meq/L (ref 19–32)
Chloride: 109 mEq/L (ref 96–112)
Creatinine, Ser: 0.9 mg/dL (ref 0.4–1.2)
GFR: 67.42 mL/min (ref >60.00)
Glucose, Bld: 87 mg/dL (ref 70–99)
Potassium: 4 mEq/L (ref 3.5–5.1)
SODIUM: 139 meq/L (ref 135–145)

## 2013-06-11 LAB — ALT: ALT: 17 U/L (ref 0–35)

## 2013-06-11 MED ORDER — REGADENOSON 0.4 MG/5ML IV SOLN
0.4000 mg | Freq: Once | INTRAVENOUS | Status: AC
  Administered 2013-06-11: 0.4 mg via INTRAVENOUS

## 2013-06-11 MED ORDER — TECHNETIUM TC 99M SESTAMIBI GENERIC - CARDIOLITE
33.0000 | Freq: Once | INTRAVENOUS | Status: AC | PRN
  Administered 2013-06-11: 33 via INTRAVENOUS

## 2013-06-11 MED ORDER — TECHNETIUM TC 99M SESTAMIBI GENERIC - CARDIOLITE
10.8000 | Freq: Once | INTRAVENOUS | Status: AC | PRN
  Administered 2013-06-11: 11 via INTRAVENOUS

## 2013-06-11 NOTE — Progress Notes (Signed)
[facility] [address] [place], [place] [address] [phone]    Cardiology Nuclear Med Study  [name] is a 78 y.o. female     MRN : [id number]     DOB: [birth year]  Procedure Date: 06/11/2013  Nuclear Med Background Indication for Stress Test:  Evaluation for Ischemia History:  No known CAD, Echo 2013 EF 73%, MPI 2012 EF 71%, AICD Cardiac Risk Factors: Family History - CAD and Lipids  Symptoms:  Chest Pain (last date of chest discomfort was today about a hour ago) and SOB (chronic)   Nuclear Pre-Procedure Caffeine/Decaff Intake:  None NPO After: 7:00am   Lungs:  clear O2 Sat: 99% on room air. IV 0.9% NS with Angio Cath:  22g  IV Site: R Hand  IV Started by:  Crissie Figures, RN  Chest Size (in):  36 Cup Size: C  Height: 5' 3"  (1.6 m)  Weight:  170 lb (77.111 kg)  BMI:  Body mass index is 30.12 kg/(m^2). Tech Comments:  N/A    Nuclear Med Study 1 or 2 day study: 1 day  Stress Test Type:  Treadmill/Lexiscan  Reading MD: N/A  Order Authorizing Provider:  Fransico Him, MD  Resting Radionuclide: Technetium 59mSestamibi  Resting Radionuclide Dose: 11.0 mCi   Stress Radionuclide:  Technetium 9106mestamibi  Stress Radionuclide Dose: 33.0 mCi           Stress Protocol Rest HR: 68 Stress HR: 122  Rest BP: 155/95 Stress BP: 154/84  Exercise Time (min): n/a METS: n/a           Dose of Adenosine (mg):  n/a Dose of Lexiscan: 0.4 mg  Dose of Atropine (mg): n/a Dose of Dobutamine: n/a mcg/kg/min (at max HR)  Stress Test Technologist: ShGlade LloydBS-ES  Nuclear Technologist:  StCharlton AmorCNMT     Rest Procedure:  Myocardial perfusion imaging was performed at rest 45 minutes following the intravenous administration of Technetium 9927mstamibi. Rest ECG: NSR - Normal EKG  Stress Procedure:  The patient received IV Lexiscan 0.4 mg over 15-seconds with concurrent low level exercise and then Technetium 60m29mtamibi was injected at  30-seconds while the patient continued walking one more minute.  Quantitative spect images were obtained after a 45-minute delay. During the infusion of Lexiscan, the patient complained of SOB, headache and chest discomfort.  These symptoms began to resolve in recovery.  Stress ECG: LBBB with stress  QPS Raw Data Images:  Normal; no motion artifact; normal heart/lung ratio. Stress Images:  Normal homogeneous uptake in all areas of the myocardium. Rest Images:  Normal homogeneous uptake in all areas of the myocardium. Subtraction (SDS):  Normal Transient Ischemic Dilatation (Normal <1.22):  1.06 Lung/Heart Ratio (Normal <0.45):  0.33  Quantitative Gated Spect Images QGS EDV:  76 ml QGS ESV:  24 ml  Impression Exercise Capacity:  Lexiscan with low level exercise. BP Response:  Normal blood pressure response. Clinical Symptoms:  There is dyspnea. ECG Impression:  LBBB Comparison with Prior Nuclear Study: No images to compare  Overall Impression:  Normal stress nuclear study.  LBBB with lexiscan  LV Ejection Fraction: 68%.  LV Wall Motion:  NL LV Function; NL Wall Motion  PeteJenkins [place]

## 2013-06-12 ENCOUNTER — Encounter: Payer: Self-pay | Admitting: General Surgery

## 2013-06-12 ENCOUNTER — Other Ambulatory Visit: Payer: Self-pay | Admitting: General Surgery

## 2013-06-12 DIAGNOSIS — E785 Hyperlipidemia, unspecified: Secondary | ICD-10-CM

## 2013-06-13 ENCOUNTER — Ambulatory Visit
Admission: RE | Admit: 2013-06-13 | Discharge: 2013-06-13 | Disposition: A | Discharge status: Home / Self Care | Payer: Medicare Other | Source: Ambulatory Visit | Attending: Internal Medicine | Admitting: Internal Medicine

## 2013-06-13 ENCOUNTER — Ambulatory Visit (INDEPENDENT_AMBULATORY_CARE_PROVIDER_SITE_OTHER): Payer: Medicare Other | Admitting: Internal Medicine

## 2013-06-13 ENCOUNTER — Telehealth: Payer: Self-pay | Admitting: Cardiology

## 2013-06-13 ENCOUNTER — Encounter: Payer: Self-pay | Admitting: Internal Medicine

## 2013-06-13 VITALS — BP 136/80 | HR 62 | Ht 63.0 in | Wt 171.4 lb

## 2013-06-13 DIAGNOSIS — I509 Heart failure, unspecified: Secondary | ICD-10-CM

## 2013-06-13 DIAGNOSIS — C73 Malignant neoplasm of thyroid gland: Secondary | ICD-10-CM | POA: Diagnosis not present

## 2013-06-13 DIAGNOSIS — I428 Other cardiomyopathies: Secondary | ICD-10-CM

## 2013-06-13 DIAGNOSIS — I420 Dilated cardiomyopathy: Secondary | ICD-10-CM

## 2013-06-13 DIAGNOSIS — I10 Essential (primary) hypertension: Secondary | ICD-10-CM

## 2013-06-13 NOTE — Telephone Encounter (Signed)
Gave pt results of Stress test.

## 2013-06-13 NOTE — Progress Notes (Signed)
HPI [name] as youreturns today for followup. She is a very pleasant 78 year old woman with a history of chronic systolic heart failure, diastolic heart failure, hypertension, and is status post ICD implantation many years ago. She has had no ICD therapies. Over the last 3 years, her left ventricular function has normalized. She still has heart failure symptoms which are class II. The patient has not had syncope or chest pain. Allergies  Allergen Reactions  . Aspirin Swelling    CAN TOLERATE 81MG     Current Outpatient Prescriptions  Medication Sig Dispense Refill  . aspirin 81 MG tablet Take 81 mg by mouth daily.        Marland Kitchen atorvastatin (LIPITOR) 20 MG tablet Take 20 mg by mouth daily.        Marland Kitchen azelastine (ASTELIN) 137 MCG/SPRAY nasal spray Place 1 spray into the nose as needed. Use in each nostril as directed       . carvedilol (COREG) 25 MG tablet TAKE 1 TABLET BY MOUTH TWICE DAILY WITH FOOD  180 tablet  1  . fish oil-omega-3 fatty acids 1000 MG capsule Take 2 g by mouth daily.        Marland Kitchen levothyroxine (SYNTHROID) 125 MCG tablet Take 125 mcg by mouth daily.        . mesalamine (APRISO) 0.375 G 24 hr capsule Take 375 mg by mouth taper from 4 doses each day to 1 dose and stop.      . Multiple Vitamin (MULTIVITAMIN) capsule Take 1 capsule by mouth daily.        . naproxen sodium (ANAPROX) 220 MG tablet Take 220 mg by mouth 2 (two) times daily with a meal.        . oxybutynin (DITROPAN XL) 15 MG 24 hr tablet Take 15 mg by mouth daily.      . ramipril (ALTACE) 10 MG capsule Take 10 mg by mouth 2 (two) times daily.       . temazepam (RESTORIL) 15 MG capsule Take 15 mg by mouth at bedtime as needed.        . vitamin C (ASCORBIC ACID) 500 MG tablet Take 500 mg by mouth daily.         No current facility-administered medications for this visit.     Past Medical History  Diagnosis Date  . GERD (gastroesophageal reflux disease)   . Ankle fracture   . Dyslipidemia   . Seasonal  allergies   . Overactive bladder   . Diverticula, colon   . Osteoarthritis     both knees  . Fecal incontinence     mild  . Depression   . Pulmonary nodule   . Colitis   . DCM (dilated cardiomyopathy)     idiopathic now with EF 70% by echo and nuclear stress test  . Hypertension   . Thyroid nodule     papillary cancer  . Post-surgical hypothyroidism   . AICD (automatic cardioverter/defibrillator) present     ROS:   All systems reviewed and negative except as noted in the HPI.   Past Surgical History  Procedure Laterality Date  . Thyroidectomy  03/29/2009    papillary cancer  . Broken ankle    . Tore knee    . Cardiac defibrillator placement    . Abdominal hysterectomy    . Tonsillectomy    . Nose surgery  1987  . Total abdominal hysterectomy w/ bilateral salpingoophorectomy  1988  . Tonsilectomy, adenoidectomy, bilateral myringotomy  and tubes  1956  . Knee surgery  01/2002    both  . Left ankle surgery  08/1999     Family History  Problem Relation Age of Onset  . Emphysema Father   . Hypertension Mother   . Cancer Brother      History   Social History  . Marital Status: Divorced    Spouse Name: N/A    Number of Children: N/A  . Years of Education: N/A   Occupational History  . Not on file.   Social History Main Topics  . Smoking status: Never Smoker   . Smokeless tobacco: Not on file  . Alcohol Use: Yes     Comment: rare  . Drug Use: No  . Sexual Activity: Not on file   Other Topics Concern  . Not on file   Social History Narrative  . No narrative on file     BP 136/80  Pulse 62  Ht 5' 3"  (1.6 m)  Wt 171 lb 6.4 oz (77.747 kg)  BMI 30.37 kg/m2  Physical Exam:  Well appearing NAD HEENT: Unremarkable Neck:  No JVD, no thyromegally Back:  No CVA tenderness Lungs:  Clear with no wheezes, rales, or rhonchi. HEART:  Regular rate rhythm, no murmurs, no rubs, no clicks Abd:  soft, positive bowel sounds, no organomegally, no rebound, no  guarding Ext:  2 plus pulses, no edema, no cyanosis, no clubbing Skin:  No rashes no nodules Neuro:  CN II through XII intact, motor grossly intact  DEVICE  Normal device function.  See PaceArt for details. Device at elective replacement  Assess/Plan:

## 2013-06-13 NOTE — Assessment & Plan Note (Signed)
The patient has had resolution of her left ventricular dysfunction, and her ejection fraction is now normal. She does still have diastolic heart failure. She has reached elective replacement on her current device. I've recommended not placing a new device but rather removing the old device, and capping the current leads. The patient is going to travel to [place] in the next few weeks, and we will hold off on her procedure until after she returns.

## 2013-06-13 NOTE — Patient Instructions (Addendum)
PT WILL CALL WHEN BACK IN TOWN TO SCHEDULE

## 2013-06-13 NOTE — Assessment & Plan Note (Signed)
Her systolic heart failure has resolved, but she does still have class II diastolic heart failure. She will continue her current medical therapy.

## 2013-06-13 NOTE — Assessment & Plan Note (Signed)
Her blood pressure is slightly elevated. I discussed the importance of a low-sodium diet.

## 2013-06-13 NOTE — Telephone Encounter (Signed)
New message    Returning call back to nurse.

## 2013-06-24 DIAGNOSIS — C73 Malignant neoplasm of thyroid gland: Secondary | ICD-10-CM | POA: Diagnosis not present

## 2013-06-24 DIAGNOSIS — E890 Postprocedural hypothyroidism: Secondary | ICD-10-CM | POA: Diagnosis not present

## 2013-06-25 DIAGNOSIS — E890 Postprocedural hypothyroidism: Secondary | ICD-10-CM | POA: Diagnosis not present

## 2013-06-25 DIAGNOSIS — R490 Dysphonia: Secondary | ICD-10-CM | POA: Diagnosis not present

## 2013-06-25 DIAGNOSIS — D497 Neoplasm of unspecified behavior of endocrine glands and other parts of nervous system: Secondary | ICD-10-CM | POA: Diagnosis not present

## 2013-06-25 DIAGNOSIS — C73 Malignant neoplasm of thyroid gland: Secondary | ICD-10-CM | POA: Diagnosis not present

## 2013-06-27 ENCOUNTER — Encounter: Payer: Medicare Other | Admitting: Internal Medicine

## 2013-07-04 ENCOUNTER — Other Ambulatory Visit: Payer: Self-pay | Admitting: Interventional Cardiology

## 2013-07-07 ENCOUNTER — Other Ambulatory Visit: Payer: Self-pay | Admitting: Interventional Cardiology

## 2013-07-17 DIAGNOSIS — M19019 Primary osteoarthritis, unspecified shoulder: Secondary | ICD-10-CM | POA: Diagnosis not present

## 2013-07-17 DIAGNOSIS — M25569 Pain in unspecified knee: Secondary | ICD-10-CM | POA: Diagnosis not present

## 2013-07-17 DIAGNOSIS — H35379 Puckering of macula, unspecified eye: Secondary | ICD-10-CM | POA: Diagnosis not present

## 2013-09-12 ENCOUNTER — Encounter: Payer: Self-pay | Admitting: *Deleted

## 2013-09-15 ENCOUNTER — Telehealth: Payer: Self-pay | Admitting: Cardiology

## 2013-09-15 NOTE — Telephone Encounter (Signed)
New Message  Pt called states that she has had "un-describable" feelings in her chest that tingled over her entire body last night 09/14/2013.Marland Kitchen She states that she then took her BP and it was 220/95/// Pt states that she took her BP again this morning 09/15/2013 and her BP was 220/97. She is requesting a call back to discuss.

## 2013-09-16 NOTE — Telephone Encounter (Signed)
Called Pt back and advised her to see PCP for BP issues, per Dr Radford Pax.   Pt mentioned that she would like her ICD removed per conversation with Dr. Lovena Le during last OV.  Advised will send message to Dr Tanna Furry nurse.

## 2013-09-16 NOTE — Telephone Encounter (Signed)
To Triage.

## 2013-09-16 NOTE — Telephone Encounter (Signed)
Will have [name] call and schedule apt as it has been since Jan

## 2013-09-16 NOTE — Telephone Encounter (Signed)
Please have patient see her PCP today

## 2013-09-16 NOTE — Telephone Encounter (Signed)
Contacted Pt about complaint- Pt states that Sunday night she was sitting watching t.v. And started experiencing some SOB, tingling all over her body, and a "squeezing sensation" in her chest. Pt checked BP and it was 220/97. Pt stated that the following morning she took her BP and it was 171/98 and HR-82, and no SOB, tingling, or squeezing sensation noted. Pt now states that her BP 4/14 this a.m. Is 183/75 HR-58. Pt states that she is having no pain and no SOB. Verified meds with pt (Coreg 25mg  BID and Altace 10mg  BID). Told pt I will forward this message to her Primary Cardiologist Dr. Radford Pax for review and will call her back. Pt verbalized understanding.

## 2013-09-17 DIAGNOSIS — IMO0002 Reserved for concepts with insufficient information to code with codable children: Secondary | ICD-10-CM | POA: Diagnosis not present

## 2013-09-17 DIAGNOSIS — I119 Hypertensive heart disease without heart failure: Secondary | ICD-10-CM | POA: Diagnosis not present

## 2013-09-17 DIAGNOSIS — M1710 Unilateral primary osteoarthritis, unspecified knee: Secondary | ICD-10-CM | POA: Diagnosis not present

## 2013-09-17 DIAGNOSIS — G4700 Insomnia, unspecified: Secondary | ICD-10-CM | POA: Diagnosis not present

## 2013-10-01 ENCOUNTER — Other Ambulatory Visit: Payer: Self-pay | Admitting: Cardiology

## 2013-11-19 DIAGNOSIS — G479 Sleep disorder, unspecified: Secondary | ICD-10-CM | POA: Diagnosis not present

## 2013-11-19 DIAGNOSIS — IMO0002 Reserved for concepts with insufficient information to code with codable children: Secondary | ICD-10-CM | POA: Diagnosis not present

## 2013-11-19 DIAGNOSIS — R490 Dysphonia: Secondary | ICD-10-CM | POA: Diagnosis not present

## 2013-11-19 DIAGNOSIS — M1710 Unilateral primary osteoarthritis, unspecified knee: Secondary | ICD-10-CM | POA: Diagnosis not present

## 2013-11-28 DIAGNOSIS — Z85850 Personal history of malignant neoplasm of thyroid: Secondary | ICD-10-CM | POA: Diagnosis not present

## 2013-11-28 DIAGNOSIS — R490 Dysphonia: Secondary | ICD-10-CM | POA: Diagnosis not present

## 2013-12-10 ENCOUNTER — Other Ambulatory Visit (INDEPENDENT_AMBULATORY_CARE_PROVIDER_SITE_OTHER): Payer: Medicare Other

## 2013-12-10 DIAGNOSIS — E785 Hyperlipidemia, unspecified: Secondary | ICD-10-CM

## 2013-12-10 LAB — LIPID PANEL
CHOL/HDL RATIO: 4
CHOLESTEROL: 125 mg/dL (ref 0–200)
HDL: 31.6 mg/dL — ABNORMAL LOW (ref >39.00)
LDL CALC: 70 mg/dL (ref 0–99)
NonHDL: 93.4
Triglycerides: 119 mg/dL (ref 0.0–149.0)
VLDL: 23.8 mg/dL (ref 0.0–40.0)

## 2013-12-10 LAB — ALT: ALT: 13 U/L (ref 0–35)

## 2013-12-11 ENCOUNTER — Other Ambulatory Visit: Payer: Self-pay | Admitting: General Surgery

## 2013-12-11 DIAGNOSIS — E785 Hyperlipidemia, unspecified: Secondary | ICD-10-CM

## 2013-12-29 ENCOUNTER — Other Ambulatory Visit: Payer: Self-pay | Admitting: Cardiology

## 2014-01-13 ENCOUNTER — Ambulatory Visit: Payer: Medicare Other | Admitting: Cardiology

## 2014-01-20 ENCOUNTER — Encounter: Payer: Self-pay | Admitting: Internal Medicine

## 2014-01-20 ENCOUNTER — Ambulatory Visit (INDEPENDENT_AMBULATORY_CARE_PROVIDER_SITE_OTHER): Payer: Medicare Other | Admitting: Internal Medicine

## 2014-01-20 VITALS — BP 136/78 | HR 65 | Ht 62.0 in | Wt 171.0 lb

## 2014-01-20 DIAGNOSIS — I519 Heart disease, unspecified: Secondary | ICD-10-CM | POA: Diagnosis not present

## 2014-01-20 DIAGNOSIS — I10 Essential (primary) hypertension: Secondary | ICD-10-CM | POA: Diagnosis not present

## 2014-01-20 DIAGNOSIS — Z95810 Presence of automatic (implantable) cardiac defibrillator: Secondary | ICD-10-CM | POA: Diagnosis not present

## 2014-01-20 LAB — MDC_IDC_ENUM_SESS_TYPE_INCLINIC
Battery Voltage: 2.62 V
Date Time Interrogation Session: 20150818123654
HIGH POWER IMPEDANCE MEASURED VALUE: 41 Ohm
HIGH POWER IMPEDANCE MEASURED VALUE: 41 Ohm
HIGH POWER IMPEDANCE MEASURED VALUE: 42 Ohm
HIGH POWER IMPEDANCE MEASURED VALUE: 43 Ohm
HIGH POWER IMPEDANCE MEASURED VALUE: 43 Ohm
HIGH POWER IMPEDANCE MEASURED VALUE: 44 Ohm
HIGH POWER IMPEDANCE MEASURED VALUE: 53 Ohm
HIGH POWER IMPEDANCE MEASURED VALUE: 54 Ohm
HIGH POWER IMPEDANCE MEASURED VALUE: 54 Ohm
HIGH POWER IMPEDANCE MEASURED VALUE: 54 Ohm
HIGH POWER IMPEDANCE MEASURED VALUE: 54 Ohm
HighPow Impedance: 41 Ohm
HighPow Impedance: 41 Ohm
HighPow Impedance: 42 Ohm
HighPow Impedance: 43 Ohm
HighPow Impedance: 43 Ohm
HighPow Impedance: 44 Ohm
HighPow Impedance: 44 Ohm
HighPow Impedance: 44 Ohm
HighPow Impedance: 44 Ohm
HighPow Impedance: 44 Ohm
HighPow Impedance: 51 Ohm
HighPow Impedance: 53 Ohm
HighPow Impedance: 53 Ohm
HighPow Impedance: 53 Ohm
HighPow Impedance: 53 Ohm
HighPow Impedance: 54 Ohm
HighPow Impedance: 54 Ohm
HighPow Impedance: 55 Ohm
HighPow Impedance: 55 Ohm
HighPow Impedance: 55 Ohm
Lead Channel Impedance Value: 328 Ohm
Lead Channel Impedance Value: 332 Ohm
Lead Channel Impedance Value: 336 Ohm
Lead Channel Impedance Value: 340 Ohm
Lead Channel Impedance Value: 340 Ohm
Lead Channel Impedance Value: 344 Ohm
Lead Channel Impedance Value: 344 Ohm
Lead Channel Impedance Value: 364 Ohm
Lead Channel Impedance Value: 368 Ohm
Lead Channel Sensing Intrinsic Amplitude: 13.6 mV
Lead Channel Sensing Intrinsic Amplitude: 14.3 mV
Lead Channel Sensing Intrinsic Amplitude: 14.3 mV
Lead Channel Sensing Intrinsic Amplitude: 14.8 mV
Lead Channel Sensing Intrinsic Amplitude: 15.2 mV
Lead Channel Sensing Intrinsic Amplitude: 16.1 mV
Lead Channel Sensing Intrinsic Amplitude: 16.2 mV
Lead Channel Sensing Intrinsic Amplitude: 16.8 mV
Lead Channel Sensing Intrinsic Amplitude: 17.2 mV
Lead Channel Sensing Intrinsic Amplitude: 17.2 mV
Lead Channel Sensing Intrinsic Amplitude: 17.6 mV
Lead Channel Setting Sensing Sensitivity: 0.3 mV
MDC IDC MSMT LEADCHNL RV IMPEDANCE VALUE: 320 Ohm
MDC IDC MSMT LEADCHNL RV IMPEDANCE VALUE: 324 Ohm
MDC IDC MSMT LEADCHNL RV IMPEDANCE VALUE: 332 Ohm
MDC IDC MSMT LEADCHNL RV IMPEDANCE VALUE: 332 Ohm
MDC IDC MSMT LEADCHNL RV IMPEDANCE VALUE: 336 Ohm
MDC IDC MSMT LEADCHNL RV IMPEDANCE VALUE: 340 Ohm
MDC IDC MSMT LEADCHNL RV SENSING INTR AMPL: 13.3 mV
MDC IDC MSMT LEADCHNL RV SENSING INTR AMPL: 16.4 mV
MDC IDC MSMT LEADCHNL RV SENSING INTR AMPL: 17.2 mV
MDC IDC MSMT LEADCHNL RV SENSING INTR AMPL: 17.5 mV
MDC IDC SET LEADCHNL RV PACING AMPLITUDE: 3 V
MDC IDC SET LEADCHNL RV PACING PULSEWIDTH: 0.4 ms
MDC IDC STAT BRADY RV PERCENT PACED: 0 %
Zone Setting Detection Interval: 240 ms
Zone Setting Detection Interval: 300 ms
Zone Setting Detection Interval: 350 ms

## 2014-01-20 NOTE — Assessment & Plan Note (Signed)
Her blood pressure is elevated. She will followup with Dr. Radford Pax.

## 2014-01-20 NOTE — Progress Notes (Signed)
HPI [name] returns today for followup. She is a very pleasant 78 year old woman with a history of chronic systolic heart failure, diastolic heart failure, hypertension, and is status post ICD implantation many years ago. She has had no ICD therapies. Over the last 3 years, her left ventricular function has normalized. She still has heart failure symptoms which are class II. The patient has not had syncope or chest pain. She has not had an echo in 2 years. Allergies  Allergen Reactions  . Aspirin Swelling    CAN TOLERATE 81MG     Current Outpatient Prescriptions  Medication Sig Dispense Refill  . aspirin 81 MG tablet Take 81 mg by mouth daily.        Marland Kitchen atorvastatin (LIPITOR) 20 MG tablet TAKE 1 TABLET BY MOUTH ONCE DAILY  90 tablet  0  . azelastine (ASTELIN) 137 MCG/SPRAY nasal spray Place 1 spray into the nose as needed. Use in each nostril as directed       . carvedilol (COREG) 25 MG tablet TAKE ONE TABLET BY MOUTH TWICE A DAY WITH FOOD  180 tablet  0  . fish oil-omega-3 fatty acids 1000 MG capsule Take 2 g by mouth daily.        Marland Kitchen levothyroxine (SYNTHROID) 125 MCG tablet Take 125 mcg by mouth daily.        . mesalamine (APRISO) 0.375 G 24 hr capsule Take 375 mg by mouth taper from 4 doses each day to 1 dose and stop.      . Multiple Vitamin (MULTIVITAMIN) capsule Take 1 capsule by mouth daily.        . naproxen sodium (ANAPROX) 220 MG tablet Take 220 mg by mouth as needed.       Marland Kitchen oxybutynin (DITROPAN XL) 15 MG 24 hr tablet Take 15 mg by mouth daily.      . ramipril (ALTACE) 10 MG capsule TAKE ONE CAPSULE BY MOUTH TWICE A DAY  180 capsule  1  . temazepam (RESTORIL) 15 MG capsule Take 15 mg by mouth at bedtime as needed.        . vitamin C (ASCORBIC ACID) 500 MG tablet Take 500 mg by mouth daily.         No current facility-administered medications for this visit.     Past Medical History  Diagnosis Date  . GERD (gastroesophageal reflux disease)   . Ankle fracture     . Dyslipidemia   . Seasonal allergies   . Overactive bladder   . Diverticula, colon   . Osteoarthritis     both knees  . Fecal incontinence     mild  . Depression   . Pulmonary nodule   . Colitis   . DCM (dilated cardiomyopathy)     idiopathic now with EF 70% by echo and nuclear stress test  . Hypertension   . Thyroid nodule     papillary cancer  . Post-surgical hypothyroidism   . AICD (automatic cardioverter/defibrillator) present     ROS:   All systems reviewed and negative except as noted in the HPI.   Past Surgical History  Procedure Laterality Date  . Thyroidectomy  03/29/2009    papillary cancer  . Broken ankle    . Tore knee    . Cardiac defibrillator placement    . Abdominal hysterectomy    . Tonsillectomy    . Nose surgery  1987  . Total abdominal hysterectomy w/ bilateral salpingoophorectomy  1988  . Tonsilectomy,  adenoidectomy, bilateral myringotomy and tubes  1956  . Knee surgery  01/2002    both  . Left ankle surgery  08/1999     Family History  Problem Relation Age of Onset  . Emphysema Father   . Hypertension Mother   . Cancer Brother      History   Social History  . Marital Status: Divorced    Spouse Name: N/A    Number of Children: N/A  . Years of Education: N/A   Occupational History  . Not on file.   Social History Main Topics  . Smoking status: Never Smoker   . Smokeless tobacco: Not on file  . Alcohol Use: Yes     Comment: rare  . Drug Use: No  . Sexual Activity: Not on file   Other Topics Concern  . Not on file   Social History Narrative  . No narrative on file     BP 136/78  Pulse 65  Ht 5' 2"  (1.575 m)  Wt 171 lb (77.565 kg)  BMI 31.27 kg/m2  Physical Exam:  Well appearing 78 yo woman, NAD HEENT: Unremarkable Neck:  No JVD, no thyromegally Back:  No CVA tenderness Lungs:  Clear with no wheezes, rales, or rhonchi. HEART:  Regular rate rhythm, no murmurs, no rubs, no clicks Abd:  soft, positive bowel  sounds, no organomegally, no rebound, no guarding Ext:  2 plus pulses, no edema, no cyanosis, no clubbing Skin:  No rashes no nodules Neuro:  CN II through XII intact, motor grossly intact  DEVICE  Normal device function.  See PaceArt for details. Device at end of life.  Assess/Plan:

## 2014-01-20 NOTE — Patient Instructions (Addendum)
Your physician recommends that you continue on your current medications as directed. Please refer to the Current Medication list given to you today.  Your physician has requested that you have an echocardiogram. Echocardiography is a painless test that uses sound waves to create images of your heart. It provides your doctor with information about the size and shape of your heart and how well your heart's chambers and valves are working. This procedure takes approximately one hour. There are no restrictions for this procedure.  We will discuss the plan for follow up after Dr. Lovena Le reviews the results of your echocardiogram.

## 2014-01-20 NOTE — Assessment & Plan Note (Signed)
Her device is at end of life. If her EF is still good by echo, we will remove the old device, cap the leads and not place a new device. She does not want a lead extraction.

## 2014-01-21 ENCOUNTER — Other Ambulatory Visit: Payer: Self-pay | Admitting: *Deleted

## 2014-01-21 MED ORDER — RAMIPRIL 10 MG PO CAPS
ORAL_CAPSULE | ORAL | Status: DC
Start: 2014-01-21 — End: 2014-04-20

## 2014-01-23 ENCOUNTER — Ambulatory Visit (HOSPITAL_COMMUNITY): Payer: Medicare Other | Attending: Cardiology | Admitting: Radiology

## 2014-01-23 ENCOUNTER — Encounter: Payer: Self-pay | Admitting: *Deleted

## 2014-01-23 DIAGNOSIS — I519 Heart disease, unspecified: Secondary | ICD-10-CM | POA: Insufficient documentation

## 2014-01-23 DIAGNOSIS — I428 Other cardiomyopathies: Secondary | ICD-10-CM | POA: Diagnosis not present

## 2014-01-23 NOTE — Progress Notes (Signed)
Echocardiogram performed.  

## 2014-02-06 ENCOUNTER — Ambulatory Visit: Payer: Medicare Other | Admitting: Cardiology

## 2014-02-06 ENCOUNTER — Encounter: Payer: Self-pay | Admitting: Cardiology

## 2014-02-06 ENCOUNTER — Ambulatory Visit (INDEPENDENT_AMBULATORY_CARE_PROVIDER_SITE_OTHER): Payer: Medicare Other | Admitting: Cardiology

## 2014-02-06 VITALS — BP 142/82 | HR 60 | Ht 63.0 in | Wt 160.0 lb

## 2014-02-06 DIAGNOSIS — R079 Chest pain, unspecified: Secondary | ICD-10-CM

## 2014-02-06 DIAGNOSIS — E785 Hyperlipidemia, unspecified: Secondary | ICD-10-CM | POA: Diagnosis not present

## 2014-02-06 DIAGNOSIS — I420 Dilated cardiomyopathy: Secondary | ICD-10-CM

## 2014-02-06 DIAGNOSIS — I428 Other cardiomyopathies: Secondary | ICD-10-CM | POA: Diagnosis not present

## 2014-02-06 DIAGNOSIS — I10 Essential (primary) hypertension: Secondary | ICD-10-CM

## 2014-02-06 LAB — BASIC METABOLIC PANEL
BUN: 17 mg/dL (ref 6–23)
CO2: 25 mEq/L (ref 19–32)
Calcium: 9.3 mg/dL (ref 8.4–10.5)
Chloride: 107 mEq/L (ref 96–112)
Creatinine, Ser: 0.9 mg/dL (ref 0.4–1.2)
GFR: 61.55 mL/min (ref >60.00)
GLUCOSE: 98 mg/dL (ref 70–99)
POTASSIUM: 4.2 meq/L (ref 3.5–5.1)
Sodium: 138 mEq/L (ref 135–145)

## 2014-02-06 MED ORDER — AMLODIPINE BESYLATE 2.5 MG PO TABS: 2.5000 mg | ORAL_TABLET | Freq: Every day | ORAL | Status: DC

## 2014-02-06 NOTE — Patient Instructions (Addendum)
Your physician has recommended you make the following change in your medication: Start Amlodipine 2.5 MG 1 tablet daily  Dr Radford Pax wants you to keep a diary for the next week and let us know when your having chest pain. Please try to take your blood pressure at that time and take your maalox. Let us know if that helps with the Chest pain.  Your physician has requested that you regularly monitor and record your blood pressure readings at home. Please use the same machine at the same time of day to check your readings and record them for one week and call us with the results.  We have set you up with an appointment with Dr Watt Climes. This has been scheduled for 02/11/14 at 9:45. They asked that you arrive by 9:30.  Your physician recommends that you go to the lab for a BMET today  Your physician wants you to follow-up in: 6 months with Dr Mallie Snooks will receive a reminder letter in the mail two months in advance. If you don't receive a letter, please call our office to schedule the follow-up appointment.

## 2014-02-06 NOTE — Progress Notes (Signed)
[address], [place], [place]  [address] Phone: [phone] Fax:  [phone]  Date:  02/06/2014   ID:  [name], DOB [birth year], MRN [id number]  PCP:  Beatris Si  Cardiologist:  Fransico Him, MD     History of Present Illness: [name] is a 78 y.o. female with a history of idiopathic DCM now with EF 70%, HTN and chronic SOB due to GERD who presents today for followup. She is doing well. She denies any LE edema, dizziness, palpitations or syncope. She has chronic SOB which has been felt to be due to GERD in the past and is very stable. She says she does not sleep well so she feels tired during the day. She has been having some problems with her BP shooting up.  She saw her PCP and he thought that it might be due to Advil usage for knee pain.  Since stopping the advil the BP has improved.  She has atypical CP that she has had for some time and  stress test in January was normal and is was felt to be due to her GERD.  She had a normal cath in 2006.  She continues to have chest pain that is very vague in description and she has trouble describing it.  Sometimes it occurs in the midsternal area and other times it is to the right of her sternum and sometimes it is over her AICD.  She does not know if it is her GERD or due to panic attacks.   Wt Readings from Last 3 Encounters:  02/06/14 160 lb (72.576 kg)  01/20/14 171 lb (77.565 kg)  06/13/13 171 lb 6.4 oz (77.747 kg)     Past Medical History  Diagnosis Date  . GERD (gastroesophageal reflux disease)   . Ankle fracture   . Dyslipidemia   . Seasonal allergies   . Overactive bladder   . Diverticula, colon   . Osteoarthritis     both knees  . Fecal incontinence     mild  . Depression   . Pulmonary nodule   . Colitis   . DCM (dilated cardiomyopathy)     idiopathic now with EF 70% by echo and nuclear stress test  . Hypertension   . Thyroid nodule     papillary cancer  . Post-surgical hypothyroidism    . AICD (automatic cardioverter/defibrillator) present     Current Outpatient Prescriptions  Medication Sig Dispense Refill  . aspirin 81 MG tablet Take 81 mg by mouth daily.        Marland Kitchen atorvastatin (LIPITOR) 20 MG tablet TAKE 1/2 TABLET BY MOUTH ONCE DAILY      . azelastine (ASTELIN) 137 MCG/SPRAY nasal spray Place 1 spray into the nose as needed. Use in each nostril as directed       . carvedilol (COREG) 25 MG tablet TAKE ONE TABLET BY MOUTH TWICE A DAY WITH FOOD  180 tablet  0  . DULoxetine (CYMBALTA) 30 MG capsule Take 30 mg by mouth daily.      . fish oil-omega-3 fatty acids 1000 MG capsule Take 2 g by mouth daily.        Marland Kitchen levothyroxine (SYNTHROID) 125 MCG tablet Take 125 mcg by mouth daily.        . mesalamine (APRISO) 0.375 G 24 hr capsule Take 375 mg by mouth daily. Taper from 2 doses each day to 1 dose and then stop.      . Multiple Vitamin (  MULTIVITAMIN) capsule Take 1 capsule by mouth daily.        Marland Kitchen oxybutynin (DITROPAN XL) 15 MG 24 hr tablet Take 15 mg by mouth daily.      . ramipril (ALTACE) 10 MG capsule TAKE ONE CAPSULE BY MOUTH TWICE A DAY  180 capsule  0  . temazepam (RESTORIL) 15 MG capsule Take 15 mg by mouth at bedtime as needed.        . vitamin C (ASCORBIC ACID) 500 MG tablet Take 500 mg by mouth daily.         No current facility-administered medications for this visit.    Allergies:    Allergies  Allergen Reactions  . Aspirin Swelling    CAN TOLERATE 81MG    Social History:  The patient  reports that she has never smoked. She does not have any smokeless tobacco history on file. She reports that she drinks alcohol. She reports that she does not use illicit drugs.   Family History:  The patient's family history includes Cancer in her brother; Emphysema in her father; Hypertension in her mother.   ROS:  Please see the history of present illness.      All other systems reviewed and negative.   PHYSICAL EXAM: VS:  BP 142/82  Pulse 60  Ht 5' 3"  (1.6 m)  Wt  160 lb (72.576 kg)  BMI 28.35 kg/m2 Well nourished, well developed, in no acute distress HEENT: normal Neck: no JVD Cardiac:  normal S1, S2; RRR; no murmur Lungs:  clear to auscultation bilaterally, no wheezing, rhonchi or rales Abd: soft, nontender, no hepatomegaly Ext: no edema Skin: warm and dry Neuro:  CNs 2-12 intact, no focal abnormalities noted     ASSESSMENT AND PLAN:  1.  Idiopathic DCM now with normalized EF 50-55%  2.  HTN mildly elevated - she has been having problems with her BP going high at home  - continue Ramipril/carvedilol  - add amlodipine 2.50m daily - BP check daily for a week and call with results - check BMET  3. Dyslipidemia - LDL at goal at 70 - continue Lipitor  4.  GERD with recurrent chest discomfort now not on a PPI due to side effect in the past - refer to GI for further evaluation Dr. MWatt Climes5.  Chest pain - atypical and complains of multiple different types that are hard to differentiate.  It is reassuring that her nuclear stress test was normal in January.  She has GERD and has been off of her PPI because she says it made her throat swell up.  I have asked her to keep a diary of her CP episodes.  I would like her to check her BP during an episode and document it.  Also when she gets the pain I want her to take some maalox and record whether it improved.  I would like her to call me to let me know what her diary entries were in the next week.   Followup with me in 6 months    Signed, TFransico Him MD 02/06/2014 11:24 AM

## 2014-02-11 DIAGNOSIS — K5289 Other specified noninfective gastroenteritis and colitis: Secondary | ICD-10-CM | POA: Diagnosis not present

## 2014-02-11 DIAGNOSIS — R109 Unspecified abdominal pain: Secondary | ICD-10-CM | POA: Diagnosis not present

## 2014-02-11 DIAGNOSIS — K2100 Gastro-esophageal reflux disease with esophagitis, without bleeding: Secondary | ICD-10-CM | POA: Diagnosis not present

## 2014-02-20 ENCOUNTER — Encounter: Payer: Self-pay | Admitting: Internal Medicine

## 2014-04-09 ENCOUNTER — Other Ambulatory Visit: Payer: Self-pay | Admitting: Internal Medicine

## 2014-04-20 ENCOUNTER — Other Ambulatory Visit: Payer: Self-pay | Admitting: Cardiology

## 2014-05-21 DIAGNOSIS — G4700 Insomnia, unspecified: Secondary | ICD-10-CM | POA: Diagnosis not present

## 2014-05-21 DIAGNOSIS — M170 Bilateral primary osteoarthritis of knee: Secondary | ICD-10-CM | POA: Diagnosis not present

## 2014-05-25 ENCOUNTER — Encounter: Payer: Self-pay | Admitting: Internal Medicine

## 2014-06-01 DIAGNOSIS — Z23 Encounter for immunization: Secondary | ICD-10-CM | POA: Diagnosis not present

## 2014-06-02 ENCOUNTER — Encounter: Payer: Self-pay | Admitting: Cardiology

## 2014-06-12 ENCOUNTER — Other Ambulatory Visit (INDEPENDENT_AMBULATORY_CARE_PROVIDER_SITE_OTHER): Payer: Medicare Other | Admitting: *Deleted

## 2014-06-12 DIAGNOSIS — E785 Hyperlipidemia, unspecified: Secondary | ICD-10-CM | POA: Diagnosis not present

## 2014-06-12 LAB — LIPID PANEL
CHOLESTEROL: 161 mg/dL (ref 0–200)
HDL: 45.8 mg/dL (ref >39.00)
LDL CALC: 91 mg/dL (ref 0–99)
NonHDL: 115.2
Total CHOL/HDL Ratio: 4
Triglycerides: 119 mg/dL (ref 0.0–149.0)
VLDL: 23.8 mg/dL (ref 0.0–40.0)

## 2014-06-12 LAB — HEPATIC FUNCTION PANEL
ALT: 15 U/L (ref 0–35)
AST: 14 U/L (ref 0–37)
Albumin: 3.6 g/dL (ref 3.5–5.2)
Alkaline Phosphatase: 52 U/L (ref 39–117)
Bilirubin, Direct: 0.1 mg/dL (ref 0.0–0.3)
TOTAL PROTEIN: 6.4 g/dL (ref 6.0–8.3)
Total Bilirubin: 0.8 mg/dL (ref 0.2–1.2)

## 2014-06-12 NOTE — Addendum Note (Signed)
Addended by: Eulis Foster on: 06/12/2014 09:19 AM   Modules accepted: Orders

## 2014-06-15 ENCOUNTER — Other Ambulatory Visit: Payer: Self-pay | Admitting: Cardiology

## 2014-06-16 ENCOUNTER — Telehealth: Payer: Self-pay | Admitting: Cardiology

## 2014-06-16 NOTE — Telephone Encounter (Signed)
New problem ° ° °Pt returning your call. °

## 2014-06-25 DIAGNOSIS — E890 Postprocedural hypothyroidism: Secondary | ICD-10-CM | POA: Diagnosis not present

## 2014-07-07 NOTE — Progress Notes (Signed)
Cardiology Office Note   Date:  07/08/2014   ID:  [name], DOB [birth year], MRN [id number]  PCP:  Beatris Si  Cardiologist:   Sueanne Margarita, MD   Chief Complaint  Patient presents with  . Cardiomyopathy  . Hypertension      History of Present Illness: [name] is a 79 y.o. female with a history of idiopathic DCM now with EF 70%, HTN and chronic SOB due to GERD who presents today for followup. She is doing well. She denies any LE edema, palpitations or syncope. She has chronic SOB which has been felt to be due to GERD in the past and is very stable.  She has atypical CP that she has had for some time and stress test in January was normal and is was felt to be due to her GERD. She had a normal cath in 2006.    Past Medical History  Diagnosis Date  . GERD (gastroesophageal reflux disease)   . Ankle fracture   . Dyslipidemia   . Seasonal allergies   . Overactive bladder   . Diverticula, colon   . Osteoarthritis     both knees  . Fecal incontinence     mild  . Depression   . Pulmonary nodule   . Colitis   . DCM (dilated cardiomyopathy)     idiopathic now with EF 70% by echo and nuclear stress test  . Hypertension   . Thyroid nodule     papillary cancer  . Post-surgical hypothyroidism   . AICD (automatic cardioverter/defibrillator) present     Past Surgical History  Procedure Laterality Date  . Thyroidectomy  03/29/2009    papillary cancer  . Broken ankle    . Tore knee    . Cardiac defibrillator placement    . Abdominal hysterectomy    . Tonsillectomy    . Nose surgery  1987  . Total abdominal hysterectomy w/ bilateral salpingoophorectomy  1988  . Tonsilectomy, adenoidectomy, bilateral myringotomy and tubes  1956  . Knee surgery  01/2002    both  . Left ankle surgery  08/1999     Current Outpatient Prescriptions  Medication Sig Dispense Refill  . amLODipine (NORVASC) 2.5 MG tablet TAKE 1 TABLET (2.5 MG TOTAL) BY MOUTH DAILY. 30  tablet 1  . aspirin 81 MG tablet Take 81 mg by mouth daily.      Marland Kitchen atorvastatin (LIPITOR) 20 MG tablet TAKE 1/2 TABLET BY MOUTH ONCE DAILY    . azelastine (ASTELIN) 137 MCG/SPRAY nasal spray Place 1 spray into the nose as needed. Use in each nostril as directed     . carvedilol (COREG) 25 MG tablet TAKE ONE TABLET BY MOUTH TWICE A DAY WITH FOOD 180 tablet 1  . DULoxetine (CYMBALTA) 30 MG capsule Take 30 mg by mouth daily.    . fish oil-omega-3 fatty acids 1000 MG capsule Take 2 g by mouth daily.      Marland Kitchen levothyroxine (SYNTHROID, LEVOTHROID) 112 MCG tablet Take 112 mcg by mouth daily before breakfast.    . Multiple Vitamin (MULTIVITAMIN) capsule Take 1 capsule by mouth daily.      . ramipril (ALTACE) 10 MG capsule TAKE ONE CAPSULE BY MOUTH TWICE A DAY 180 capsule 1  . temazepam (RESTORIL) 15 MG capsule Take 15 mg by mouth at bedtime as needed.      . vitamin C (ASCORBIC ACID) 500 MG tablet Take 500 mg by mouth daily.       No  current facility-administered medications for this visit.    Allergies:   Aspirin    Social History:  The patient  reports that she has never smoked. She does not have any smokeless tobacco history on file. She reports that she drinks alcohol. She reports that she does not use illicit drugs.   Family History:  The patient's family history includes Cancer in her brother; Emphysema in her father; Hypertension in her mother.    ROS:  Please see the history of present illness.   Otherwise, review of systems are positive for none.   All other systems are reviewed and negative.    PHYSICAL EXAM: VS:  BP 112/78 mmHg  Pulse 65  Ht 5' 3"  (1.6 m)  Wt 169 lb (76.658 kg)  BMI 29.94 kg/m2 , BMI Body mass index is 29.94 kg/(m^2). GEN: Well nourished, well developed, in no acute distress HEENT: normal Neck: no JVD, carotid bruits, or masses Cardiac: RRR; no murmurs, rubs, or gallops,no edema  Respiratory:  clear to auscultation bilaterally, normal work of breathing GI:  soft, nontender, nondistended, + BS MS: no deformity or atrophy Skin: warm and dry, no rash Neuro:  Strength and sensation are intact Psych: euthymic mood, full affect   EKG:  EKG was ordered today and showed NSR with septal infarct and nonspecific T wave abnormality.  No change from 2014    Recent Labs: 02/06/2014: BUN 17; Creatinine 0.9; Potassium 4.2; Sodium 138 06/12/2014: ALT 15    Lipid Panel    Component Value Date/Time   CHOL 161 06/12/2014 0920   TRIG 119.0 06/12/2014 0920   HDL 45.80 06/12/2014 0920   CHOLHDL 4 06/12/2014 0920   VLDL 23.8 06/12/2014 0920   LDLCALC 91 06/12/2014 0920      Wt Readings from Last 3 Encounters:  07/08/14 169 lb (76.658 kg)  02/06/14 160 lb (72.576 kg)  01/20/14 171 lb (77.565 kg)        ASSESSMENT AND PLAN:  1. Idiopathic DCM now with normalized EF 50-55%  2. HTN - BP controlled - continue Ramipril/carvedilol/amlodipine - check BMET  3. Dyslipidemia - LDL 91 - continue Lipitor  4. GERD - not on PPI due to history of throat swelling  Current medicines are reviewed at length with the patient today.  The patient does not have concerns regarding medicines.  The following changes have been made:  no change  Labs/ tests ordered today include: BMET     Disposition:   FU with me in 6 months   Signed, Sueanne Margarita, MD  07/08/2014 11:35 AM    [facility] [address], [place], [place]  [address] Phone: [phone]; Fax: [phone]

## 2014-07-08 ENCOUNTER — Ambulatory Visit (INDEPENDENT_AMBULATORY_CARE_PROVIDER_SITE_OTHER): Payer: Medicare Other | Admitting: Internal Medicine

## 2014-07-08 ENCOUNTER — Ambulatory Visit (INDEPENDENT_AMBULATORY_CARE_PROVIDER_SITE_OTHER): Payer: Medicare Other | Admitting: Cardiology

## 2014-07-08 ENCOUNTER — Encounter: Payer: Self-pay | Admitting: *Deleted

## 2014-07-08 ENCOUNTER — Encounter: Payer: Self-pay | Admitting: Cardiology

## 2014-07-08 ENCOUNTER — Encounter: Payer: Self-pay | Admitting: Internal Medicine

## 2014-07-08 VITALS — BP 112/78 | HR 65 | Ht 63.0 in | Wt 169.0 lb

## 2014-07-08 DIAGNOSIS — I10 Essential (primary) hypertension: Secondary | ICD-10-CM | POA: Diagnosis not present

## 2014-07-08 DIAGNOSIS — I420 Dilated cardiomyopathy: Secondary | ICD-10-CM

## 2014-07-08 DIAGNOSIS — Z95810 Presence of automatic (implantable) cardiac defibrillator: Secondary | ICD-10-CM | POA: Diagnosis not present

## 2014-07-08 DIAGNOSIS — I480 Paroxysmal atrial fibrillation: Secondary | ICD-10-CM

## 2014-07-08 DIAGNOSIS — E785 Hyperlipidemia, unspecified: Secondary | ICD-10-CM | POA: Diagnosis not present

## 2014-07-08 DIAGNOSIS — I5042 Chronic combined systolic (congestive) and diastolic (congestive) heart failure: Secondary | ICD-10-CM

## 2014-07-08 LAB — CBC WITH DIFFERENTIAL/PLATELET
BASOS PCT: 0.8 % (ref 0.0–3.0)
Basophils Absolute: 0 10*3/uL (ref 0.0–0.1)
EOS ABS: 0.1 10*3/uL (ref 0.0–0.7)
Eosinophils Relative: 1.7 % (ref 0.0–5.0)
HCT: 41.6 % (ref 36.0–46.0)
Hemoglobin: 14.3 g/dL (ref 12.0–15.0)
LYMPHS ABS: 1.6 10*3/uL (ref 0.7–4.0)
Lymphocytes Relative: 32.8 % (ref 12.0–46.0)
MCHC: 34.3 g/dL (ref 30.0–36.0)
MCV: 91.9 fl (ref 78.0–100.0)
Monocytes Absolute: 0.5 10*3/uL (ref 0.1–1.0)
Monocytes Relative: 10.7 % (ref 3.0–12.0)
NEUTROS ABS: 2.6 10*3/uL (ref 1.4–7.7)
Neutrophils Relative %: 54 % (ref 43.0–77.0)
Platelets: 197 10*3/uL (ref 150.0–400.0)
RBC: 4.52 Mil/uL (ref 3.87–5.11)
RDW: 12.6 % (ref 11.5–15.5)
WBC: 4.9 10*3/uL (ref 4.0–10.5)

## 2014-07-08 LAB — PROTIME-INR
INR: 1.1 ratio — ABNORMAL HIGH (ref 0.8–1.0)
PROTHROMBIN TIME: 11.9 s (ref 9.6–13.1)

## 2014-07-08 LAB — BASIC METABOLIC PANEL
BUN: 29 mg/dL — ABNORMAL HIGH (ref 6–23)
CO2: 30 mEq/L (ref 19–32)
Calcium: 10.1 mg/dL (ref 8.4–10.5)
Chloride: 108 mEq/L (ref 96–112)
Creatinine, Ser: 0.84 mg/dL (ref 0.40–1.20)
GFR: 70 mL/min (ref >60.00)
GLUCOSE: 105 mg/dL — AB (ref 70–99)
Potassium: 4.9 mEq/L (ref 3.5–5.1)
Sodium: 140 mEq/L (ref 135–145)

## 2014-07-08 NOTE — Assessment & Plan Note (Signed)
She has had no symptomatic atrial arrhythmias. She will undergo watchful waiting.

## 2014-07-08 NOTE — Patient Instructions (Signed)
Your physician recommends that you continue on your current medications as directed. Please refer to the Current Medication list given to you today.  Your physician wants you to follow-up in: 6 months with Dr Turner You will receive a reminder letter in the mail two months in advance. If you don't receive a letter, please call our office to schedule the follow-up appointment.  

## 2014-07-08 NOTE — Assessment & Plan Note (Signed)
Her device is at end-of-life. Her left ventricular function has normalized. She has had no ICD therapies. She would like to proceed with removal of her ICD. My plan would be to bury her ICD lead under the pectoralis major. The risk and benefits, goals and expectations, of the procedure have been discussed with the patient and she wishes to proceed. This will be scheduled in the next few weeks.

## 2014-07-08 NOTE — Progress Notes (Signed)
HPI Mrs. Koziel returns today for followup. She is a very pleasant 79 year old woman with a history of chronic systolic heart failure, diastolic heart failure, hypertension, and is status post ICD implantation many years ago. She has had no ICD therapies. Over the last 3 years, her left ventricular function has normalized. She still has heart failure symptoms which are class IIA. The patient has not had syncope or chest pain.  Allergies  Allergen Reactions  . Aspirin Swelling    CAN TOLERATE 81MG     Current Outpatient Prescriptions  Medication Sig Dispense Refill  . amLODipine (NORVASC) 2.5 MG tablet TAKE 1 TABLET (2.5 MG TOTAL) BY MOUTH DAILY. 30 tablet 1  . aspirin 81 MG tablet Take 81 mg by mouth daily.      Marland Kitchen atorvastatin (LIPITOR) 20 MG tablet TAKE 1/2 TABLET BY MOUTH ONCE DAILY    . azelastine (ASTELIN) 137 MCG/SPRAY nasal spray Place 1 spray into the nose as needed. Use in each nostril as directed     . carvedilol (COREG) 25 MG tablet TAKE ONE TABLET BY MOUTH TWICE A DAY WITH FOOD 180 tablet 1  . DULoxetine (CYMBALTA) 30 MG capsule Take 30 mg by mouth daily.    . fish oil-omega-3 fatty acids 1000 MG capsule Take 2 g by mouth daily.      Marland Kitchen levothyroxine (SYNTHROID) 125 MCG tablet Take 125 mcg by mouth daily.      . mesalamine (APRISO) 0.375 G 24 hr capsule Take 375 mg by mouth daily. Taper from 2 doses each day to 1 dose and then stop.    . Multiple Vitamin (MULTIVITAMIN) capsule Take 1 capsule by mouth daily.      Marland Kitchen oxybutynin (DITROPAN XL) 15 MG 24 hr tablet Take 15 mg by mouth daily.    . ramipril (ALTACE) 10 MG capsule TAKE ONE CAPSULE BY MOUTH TWICE A DAY 180 capsule 1  . temazepam (RESTORIL) 15 MG capsule Take 15 mg by mouth at bedtime as needed.      . vitamin C (ASCORBIC ACID) 500 MG tablet Take 500 mg by mouth daily.       No current facility-administered medications for this visit.     Past Medical History  Diagnosis Date  . GERD (gastroesophageal reflux  disease)   . Ankle fracture   . Dyslipidemia   . Seasonal allergies   . Overactive bladder   . Diverticula, colon   . Osteoarthritis     both knees  . Fecal incontinence     mild  . Depression   . Pulmonary nodule   . Colitis   . DCM (dilated cardiomyopathy)     idiopathic now with EF 70% by echo and nuclear stress test  . Hypertension   . Thyroid nodule     papillary cancer  . Post-surgical hypothyroidism   . AICD (automatic cardioverter/defibrillator) present     ROS:   All systems reviewed and negative except as noted in the HPI.   Past Surgical History  Procedure Laterality Date  . Thyroidectomy  03/29/2009    papillary cancer  . Broken ankle    . Tore knee    . Cardiac defibrillator placement    . Abdominal hysterectomy    . Tonsillectomy    . Nose surgery  1987  . Total abdominal hysterectomy w/ bilateral salpingoophorectomy  1988  . Tonsilectomy, adenoidectomy, bilateral myringotomy and tubes  1956  . Knee surgery  01/2002    both  .  Left ankle surgery  08/1999     Family History  Problem Relation Age of Onset  . Emphysema Father   . Hypertension Mother   . Cancer Brother      History   Social History  . Marital Status: Divorced    Spouse Name: N/A    Number of Children: N/A  . Years of Education: N/A   Occupational History  . Not on file.   Social History Main Topics  . Smoking status: Never Smoker   . Smokeless tobacco: Not on file  . Alcohol Use: Yes     Comment: rare  . Drug Use: No  . Sexual Activity: Not on file   Other Topics Concern  . Not on file   Social History Narrative  . No narrative on file     There were no vitals taken for this visit.  Physical Exam:  Well appearing 79 yo woman, NAD HEENT: Unremarkable Neck:  No JVD, no thyromegally Back:  No CVA tenderness Lungs:  Clear with no wheezes, rales, or rhonchi. HEART:  Regular rate rhythm, no murmurs, no rubs, no clicks Abd:  soft, positive bowel sounds, no  organomegally, no rebound, no guarding Ext:  2 plus pulses, no edema, no cyanosis, no clubbing Skin:  No rashes no nodules Neuro:  CN II through XII intact, motor grossly intact   Assess/Plan:

## 2014-07-08 NOTE — Assessment & Plan Note (Signed)
Her blood pressure is well controlled. She'll continue her current medical therapy.

## 2014-07-08 NOTE — Assessment & Plan Note (Signed)
She is encouraged to maintain a low-fat diet. She will continue her statin therapy.

## 2014-07-08 NOTE — Patient Instructions (Signed)
Your physician has recommended that you have a defibrillator generator removal. Please see the instruction sheet given to you today for more information.  Your physician recommends that you continue on your current medications as directed. Please refer to the Current Medication list given to you today.

## 2014-07-15 ENCOUNTER — Encounter (HOSPITAL_COMMUNITY): Payer: Self-pay | Admitting: Pharmacy Technician

## 2014-07-19 DIAGNOSIS — I10 Essential (primary) hypertension: Secondary | ICD-10-CM | POA: Diagnosis not present

## 2014-07-19 DIAGNOSIS — Z95810 Presence of automatic (implantable) cardiac defibrillator: Secondary | ICD-10-CM | POA: Diagnosis not present

## 2014-07-19 DIAGNOSIS — Z7982 Long term (current) use of aspirin: Secondary | ICD-10-CM | POA: Diagnosis not present

## 2014-07-19 DIAGNOSIS — K219 Gastro-esophageal reflux disease without esophagitis: Secondary | ICD-10-CM | POA: Diagnosis not present

## 2014-07-19 DIAGNOSIS — Z886 Allergy status to analgesic agent status: Secondary | ICD-10-CM | POA: Diagnosis not present

## 2014-07-19 DIAGNOSIS — Z45010 Encounter for checking and testing of cardiac pacemaker pulse generator [battery]: Secondary | ICD-10-CM | POA: Diagnosis not present

## 2014-07-19 DIAGNOSIS — I420 Dilated cardiomyopathy: Secondary | ICD-10-CM | POA: Diagnosis not present

## 2014-07-19 DIAGNOSIS — F329 Major depressive disorder, single episode, unspecified: Secondary | ICD-10-CM | POA: Diagnosis not present

## 2014-07-19 DIAGNOSIS — E785 Hyperlipidemia, unspecified: Secondary | ICD-10-CM | POA: Diagnosis not present

## 2014-07-19 DIAGNOSIS — Z85850 Personal history of malignant neoplasm of thyroid: Secondary | ICD-10-CM | POA: Diagnosis not present

## 2014-07-19 DIAGNOSIS — I5042 Chronic combined systolic (congestive) and diastolic (congestive) heart failure: Secondary | ICD-10-CM | POA: Diagnosis not present

## 2014-07-19 MED ORDER — SODIUM CHLORIDE 0.9 % IR SOLN
80.0000 mg | Status: AC
  Filled 2014-07-19: qty 2

## 2014-07-19 MED ORDER — CEFAZOLIN SODIUM-DEXTROSE 2-3 GM-% IV SOLR: 2.0000 g | INTRAVENOUS | Status: AC

## 2014-07-20 ENCOUNTER — Ambulatory Visit (HOSPITAL_COMMUNITY)
Admission: RE | Admit: 2014-07-20 | Discharge: 2014-07-20 | Disposition: A | Discharge status: Home / Self Care | Payer: Medicare Other | Source: Ambulatory Visit | Attending: Internal Medicine | Admitting: Internal Medicine

## 2014-07-20 ENCOUNTER — Encounter (HOSPITAL_COMMUNITY): Payer: Self-pay | Admitting: Internal Medicine

## 2014-07-20 ENCOUNTER — Encounter (HOSPITAL_COMMUNITY)
Admission: RE | Disposition: A | Discharge status: Home / Self Care | Payer: Self-pay | Source: Ambulatory Visit | Attending: Internal Medicine

## 2014-07-20 DIAGNOSIS — I10 Essential (primary) hypertension: Secondary | ICD-10-CM | POA: Insufficient documentation

## 2014-07-20 DIAGNOSIS — Z886 Allergy status to analgesic agent status: Secondary | ICD-10-CM | POA: Insufficient documentation

## 2014-07-20 DIAGNOSIS — E785 Hyperlipidemia, unspecified: Secondary | ICD-10-CM | POA: Insufficient documentation

## 2014-07-20 DIAGNOSIS — I420 Dilated cardiomyopathy: Secondary | ICD-10-CM | POA: Diagnosis not present

## 2014-07-20 DIAGNOSIS — I5042 Chronic combined systolic (congestive) and diastolic (congestive) heart failure: Secondary | ICD-10-CM | POA: Diagnosis not present

## 2014-07-20 DIAGNOSIS — Z45010 Encounter for checking and testing of cardiac pacemaker pulse generator [battery]: Secondary | ICD-10-CM | POA: Insufficient documentation

## 2014-07-20 DIAGNOSIS — Z85850 Personal history of malignant neoplasm of thyroid: Secondary | ICD-10-CM | POA: Insufficient documentation

## 2014-07-20 DIAGNOSIS — Z95810 Presence of automatic (implantable) cardiac defibrillator: Secondary | ICD-10-CM | POA: Insufficient documentation

## 2014-07-20 DIAGNOSIS — Z7982 Long term (current) use of aspirin: Secondary | ICD-10-CM | POA: Insufficient documentation

## 2014-07-20 DIAGNOSIS — K219 Gastro-esophageal reflux disease without esophagitis: Secondary | ICD-10-CM | POA: Insufficient documentation

## 2014-07-20 DIAGNOSIS — F329 Major depressive disorder, single episode, unspecified: Secondary | ICD-10-CM | POA: Insufficient documentation

## 2014-07-20 DIAGNOSIS — Z4502 Encounter for adjustment and management of automatic implantable cardiac defibrillator: Secondary | ICD-10-CM | POA: Diagnosis not present

## 2014-07-20 HISTORY — PX: GENERATOR REMOVAL: SHX5468

## 2014-07-20 LAB — SURGICAL PCR SCREEN
MRSA, PCR: NEGATIVE
Staphylococcus aureus: NEGATIVE

## 2014-07-20 SURGERY — REMOVAL, PULSE GENERATOR, ICD

## 2014-07-20 MED ORDER — MIDAZOLAM HCL 5 MG/5ML IJ SOLN
INTRAMUSCULAR | Status: AC
  Filled 2014-07-20: qty 5

## 2014-07-20 MED ORDER — CHLORHEXIDINE GLUCONATE 4 % EX LIQD
60.0000 mL | Freq: Once | CUTANEOUS | Status: DC
  Filled 2014-07-20: qty 60

## 2014-07-20 MED ORDER — ONDANSETRON HCL 4 MG/2ML IJ SOLN: 4.0000 mg | Freq: Four times a day (QID) | INTRAMUSCULAR | Status: DC | PRN

## 2014-07-20 MED ORDER — LIDOCAINE HCL (PF) 1 % IJ SOLN
INTRAMUSCULAR | Status: AC
  Filled 2014-07-20: qty 60

## 2014-07-20 MED ORDER — ACETAMINOPHEN 325 MG PO TABS: 325.0000 mg | ORAL_TABLET | ORAL | Status: DC | PRN

## 2014-07-20 MED ORDER — SODIUM CHLORIDE 0.9 % IV SOLN
INTRAVENOUS | Status: DC
  Administered 2014-07-20: 08:00:00 via INTRAVENOUS

## 2014-07-20 MED ORDER — FENTANYL CITRATE 0.05 MG/ML IJ SOLN
INTRAMUSCULAR | Status: AC
  Filled 2014-07-20: qty 2

## 2014-07-20 MED ORDER — MUPIROCIN 2 % EX OINT
1.0000 "application " | TOPICAL_OINTMENT | Freq: Once | CUTANEOUS | Status: AC
  Administered 2014-07-20: 1 via TOPICAL
  Filled 2014-07-20: qty 22

## 2014-07-20 MED ORDER — MUPIROCIN 2 % EX OINT
TOPICAL_OINTMENT | CUTANEOUS | Status: AC
  Filled 2014-07-20: qty 22

## 2014-07-20 NOTE — Interval H&P Note (Signed)
History and Physical Interval Note:  07/20/2014 9:25 AM  [name]  has presented today for surgery, with the diagnosis of ERI  The various methods of treatment have been discussed with the patient and family. After consideration of risks, benefits and other options for treatment, the patient has consented to  Procedure(s): ICD GENERATOR REMOVAL (N/A) as a surgical intervention .  The patient's history has been reviewed, patient examined, no change in status, stable for surgery.  I have reviewed the patient's chart and labs.  Questions were answered to the patient's satisfaction.     Mikle Bosworth.D.

## 2014-07-20 NOTE — H&P (View-Only) (Signed)
HPI [name] returns today for followup. She is a very pleasant 79 year old woman with a history of chronic systolic heart failure, diastolic heart failure, hypertension, and is status post ICD implantation many years ago. She has had no ICD therapies. Over the last 3 years, her left ventricular function has normalized. She still has heart failure symptoms which are class IIA. The patient has not had syncope or chest pain.  Allergies  Allergen Reactions  . Aspirin Swelling    CAN TOLERATE 81MG     Current Outpatient Prescriptions  Medication Sig Dispense Refill  . amLODipine (NORVASC) 2.5 MG tablet TAKE 1 TABLET (2.5 MG TOTAL) BY MOUTH DAILY. 30 tablet 1  . aspirin 81 MG tablet Take 81 mg by mouth daily.      Marland Kitchen atorvastatin (LIPITOR) 20 MG tablet TAKE 1/2 TABLET BY MOUTH ONCE DAILY    . azelastine (ASTELIN) 137 MCG/SPRAY nasal spray Place 1 spray into the nose as needed. Use in each nostril as directed     . carvedilol (COREG) 25 MG tablet TAKE ONE TABLET BY MOUTH TWICE A DAY WITH FOOD 180 tablet 1  . DULoxetine (CYMBALTA) 30 MG capsule Take 30 mg by mouth daily.    . fish oil-omega-3 fatty acids 1000 MG capsule Take 2 g by mouth daily.      Marland Kitchen levothyroxine (SYNTHROID) 125 MCG tablet Take 125 mcg by mouth daily.      . mesalamine (APRISO) 0.375 G 24 hr capsule Take 375 mg by mouth daily. Taper from 2 doses each day to 1 dose and then stop.    . Multiple Vitamin (MULTIVITAMIN) capsule Take 1 capsule by mouth daily.      Marland Kitchen oxybutynin (DITROPAN XL) 15 MG 24 hr tablet Take 15 mg by mouth daily.    . ramipril (ALTACE) 10 MG capsule TAKE ONE CAPSULE BY MOUTH TWICE A DAY 180 capsule 1  . temazepam (RESTORIL) 15 MG capsule Take 15 mg by mouth at bedtime as needed.      . vitamin C (ASCORBIC ACID) 500 MG tablet Take 500 mg by mouth daily.       No current facility-administered medications for this visit.     Past Medical History  Diagnosis Date  . GERD (gastroesophageal reflux  disease)   . Ankle fracture   . Dyslipidemia   . Seasonal allergies   . Overactive bladder   . Diverticula, colon   . Osteoarthritis     both knees  . Fecal incontinence     mild  . Depression   . Pulmonary nodule   . Colitis   . DCM (dilated cardiomyopathy)     idiopathic now with EF 70% by echo and nuclear stress test  . Hypertension   . Thyroid nodule     papillary cancer  . Post-surgical hypothyroidism   . AICD (automatic cardioverter/defibrillator) present     ROS:   All systems reviewed and negative except as noted in the HPI.   Past Surgical History  Procedure Laterality Date  . Thyroidectomy  03/29/2009    papillary cancer  . Broken ankle    . Tore knee    . Cardiac defibrillator placement    . Abdominal hysterectomy    . Tonsillectomy    . Nose surgery  1987  . Total abdominal hysterectomy w/ bilateral salpingoophorectomy  1988  . Tonsilectomy, adenoidectomy, bilateral myringotomy and tubes  1956  . Knee surgery  01/2002    both  .  Left ankle surgery  08/1999     Family History  Problem Relation Age of Onset  . Emphysema Father   . Hypertension Mother   . Cancer Brother      History   Social History  . Marital Status: Divorced    Spouse Name: N/A    Number of Children: N/A  . Years of Education: N/A   Occupational History  . Not on file.   Social History Main Topics  . Smoking status: Never Smoker   . Smokeless tobacco: Not on file  . Alcohol Use: Yes     Comment: rare  . Drug Use: No  . Sexual Activity: Not on file   Other Topics Concern  . Not on file   Social History Narrative  . No narrative on file     There were no vitals taken for this visit.  Physical Exam:  Well appearing 79 yo woman, NAD HEENT: Unremarkable Neck:  No JVD, no thyromegally Back:  No CVA tenderness Lungs:  Clear with no wheezes, rales, or rhonchi. HEART:  Regular rate rhythm, no murmurs, no rubs, no clicks Abd:  soft, positive bowel sounds, no  organomegally, no rebound, no guarding Ext:  2 plus pulses, no edema, no cyanosis, no clubbing Skin:  No rashes no nodules Neuro:  CN II through XII intact, motor grossly intact   Assess/Plan:

## 2014-07-20 NOTE — CV Procedure (Signed)
EP Procedure Note  Procedure: removal of a previously implanted ICD  Preprocedure diagnosis: s/p ICD with the device at ERI and patient's LV function reverted to normal with no prior ICD therapies.  Postoperative diagnosis: same as preprocedure  Description of the procedure: after informed consent was obtained, the patient was taken to the diagnostic EP lab in the fasting state. After the usual preparation and draping IV versed and fentanyl were used for sedation. 30 cc of lidocaine was infiltrated into the left infraclavicular region. A 6 cm incision was carried out. Electrocautery was utilized to dissect down to the fascial plane and the ICD pocket was entered. The generator was removed and the lead was disconnected from the ICD. The lead was capped. The pocket was irrigated with antibiotic irrigation and the incision was closed with electrocautery. Benzoin and steristrips were painted on the skin and the patient was returned to his room in satisfactory condition.   Complications: none immediately  EBL: less than 5 cc.  Conclusion: successful removal of a previously implanted ICD without immediate complication.  Cristopher Peru, M.D.

## 2014-07-20 NOTE — Discharge Instructions (Signed)
Pacemaker Battery Change, Care After Refer to this sheet in the next few weeks. These instructions provide you with information on caring for yourself after your procedure. Your health care provider may also give you more specific instructions. Your treatment has been planned according to current medical practices, but problems sometimes occur. Call your health care provider if you have any problems or questions after your procedure. WHAT TO EXPECT AFTER THE PROCEDURE After your procedure, it is typical to have the following sensations:  Soreness at the pacemaker site. HOME CARE INSTRUCTIONS   Keep the incision clean and dry.  Remove outer dressing tomorrow and leave steri-strips on; do not get site wet for one week  For the first week after the replacement, avoid stretching motions that pull at the incision site, and avoid heavy exercise with the arm that is on the same side as the incision.  Take medicines only as directed by your health care provider.  Keep all follow-up visits as directed by your health care provider. SEEK MEDICAL CARE IF:   You have pain at the incision site that is not relieved by over-the-counter or prescription medicine.  There is drainage or pus from the incision site.  There is swelling larger than a lime at the incision site.  You develop red streaking that extends above or below the incision site.  You feel brief, intermittent palpitations, light-headedness, or any symptoms that you feel might be related to your heart. SEEK IMMEDIATE MEDICAL CARE IF:   You experience chest pain that is different than the pain at the pacemaker site.  You experience shortness of breath.  You have palpitations or irregular heartbeat.  You have light-headedness that does not go away quickly.  You faint.  You have pain that gets worse and is not relieved by medicine. Document Released: 03/12/2013 Document Revised: 10/06/2013 Document Reviewed: 03/12/2013 [facility]  Patient Information 2015 [place], [place]. This information is not intended to replace advice given to you by your health care provider. Make sure you discuss any questions you have with your health care provider.

## 2014-07-22 ENCOUNTER — Other Ambulatory Visit: Payer: Self-pay

## 2014-07-22 MED ORDER — CARVEDILOL 25 MG PO TABS: 25.0000 mg | ORAL_TABLET | Freq: Two times a day (BID) | ORAL | Status: DC

## 2014-07-23 ENCOUNTER — Other Ambulatory Visit: Payer: Self-pay

## 2014-07-23 MED ORDER — AMLODIPINE BESYLATE 2.5 MG PO TABS: ORAL_TABLET | ORAL | Status: DC

## 2014-07-27 ENCOUNTER — Other Ambulatory Visit: Payer: Self-pay

## 2014-07-27 MED ORDER — AMLODIPINE BESYLATE 2.5 MG PO TABS: ORAL_TABLET | ORAL | Status: DC

## 2014-07-31 ENCOUNTER — Other Ambulatory Visit: Payer: Self-pay | Admitting: *Deleted

## 2014-07-31 MED ORDER — RAMIPRIL 10 MG PO CAPS: 10.0000 mg | ORAL_CAPSULE | Freq: Two times a day (BID) | ORAL | Status: DC

## 2014-08-04 DIAGNOSIS — Z85850 Personal history of malignant neoplasm of thyroid: Secondary | ICD-10-CM | POA: Diagnosis not present

## 2014-08-04 DIAGNOSIS — E890 Postprocedural hypothyroidism: Secondary | ICD-10-CM | POA: Diagnosis not present

## 2014-10-21 ENCOUNTER — Encounter: Payer: Self-pay | Admitting: Cardiology

## 2014-10-21 ENCOUNTER — Other Ambulatory Visit: Payer: Self-pay

## 2014-10-21 MED ORDER — AMLODIPINE BESYLATE 2.5 MG PO TABS: ORAL_TABLET | ORAL | Status: DC

## 2014-11-19 DIAGNOSIS — M170 Bilateral primary osteoarthritis of knee: Secondary | ICD-10-CM | POA: Diagnosis not present

## 2014-12-21 ENCOUNTER — Other Ambulatory Visit: Payer: Self-pay | Admitting: Cardiology

## 2015-01-11 ENCOUNTER — Ambulatory Visit (INDEPENDENT_AMBULATORY_CARE_PROVIDER_SITE_OTHER): Payer: Medicare Other | Admitting: Cardiology

## 2015-01-11 ENCOUNTER — Encounter: Payer: Self-pay | Admitting: Cardiology

## 2015-01-11 VITALS — BP 110/58 | HR 77 | Ht 63.0 in | Wt 173.2 lb

## 2015-01-11 DIAGNOSIS — I10 Essential (primary) hypertension: Secondary | ICD-10-CM

## 2015-01-11 DIAGNOSIS — I420 Dilated cardiomyopathy: Secondary | ICD-10-CM

## 2015-01-11 DIAGNOSIS — E785 Hyperlipidemia, unspecified: Secondary | ICD-10-CM | POA: Diagnosis not present

## 2015-01-11 NOTE — Patient Instructions (Signed)

## 2015-01-11 NOTE — Progress Notes (Signed)
Cardiology Office Note   Date:  01/11/2015   ID:  [name], DOB 1935-11-16, MRN [id number]  PCP:  Beatris Si    Chief Complaint  Patient presents with  . Follow-up    DCM      History of Present Illness: [name] is a 79 y.o. female with a history of idiopathic DCM now with EF 70%, HTN and chronic SOB due to GERD who presents today for followup. She is doing well. She denies any LE edema, palpitations or syncope. She has chronic SOB which has been felt to be due to GERD in the past and is very stable. She has atypical CP that she has had for some time and stress test in January was normal and is was felt to be due to her GERD. She had a normal cath in 2006. A few nights ago she awakened with visual problems with lightning bolts in her eyes.  She call EMS and was checked out and was told everything was ok.       Past Medical History  Diagnosis Date  . GERD (gastroesophageal reflux disease)   . Ankle fracture   . Dyslipidemia   . Seasonal allergies   . Overactive bladder   . Diverticula, colon   . Osteoarthritis     both knees  . Fecal incontinence     mild  . Depression   . Pulmonary nodule   . Colitis   . DCM (dilated cardiomyopathy)     idiopathic now with EF 70% by echo and nuclear stress test  . Hypertension   . Thyroid nodule     papillary cancer  . Post-surgical hypothyroidism   . AICD (automatic cardioverter/defibrillator) present     Past Surgical History  Procedure Laterality Date  . Thyroidectomy  03/29/2009    papillary cancer  . Broken ankle    . Tore knee    . Cardiac defibrillator placement    . Abdominal hysterectomy    . Tonsillectomy    . Nose surgery  1987  . Total abdominal hysterectomy w/ bilateral salpingoophorectomy  1988  . Tonsilectomy, adenoidectomy, bilateral myringotomy and tubes  1956  . Knee surgery  01/2002    both  . Left ankle surgery  08/1999  . Generator removal N/A 07/20/2014    Procedure: ICD GENERATOR REMOVAL;  Surgeon: Evans Lance, MD;  Location: [facility] CATH LAB;  Service: Cardiovascular;  Laterality: N/A;     Current Outpatient Prescriptions  Medication Sig Dispense Refill  . acetaminophen (TYLENOL) 500 MG tablet Take 1,000 mg by mouth every 6 (six) hours as needed for moderate pain.    Marland Kitchen amLODipine (NORVASC) 2.5 MG tablet TAKE 1 TABLET (2.5 MG TOTAL) BY MOUTH DAILY. 90 tablet 1  . amoxicillin (AMOXIL) 500 MG capsule Take 500 mg by mouth 3 (three) times daily.    Marland Kitchen aspirin 81 MG tablet Take 81 mg by mouth daily.      Marland Kitchen azelastine (ASTELIN) 137 MCG/SPRAY nasal spray Place 1 spray into the nose as needed. Use in each nostril as directed     . carvedilol (COREG) 25 MG tablet TAKE 1 TABLET TWICE A DAY WITH MEALS 180 tablet 1  . DULoxetine (CYMBALTA) 30 MG capsule Take 30 mg by mouth daily.    . fish oil-omega-3 fatty acids 1000 MG capsule Take 2 g by mouth daily.      Marland Kitchen  HYDROcodone-acetaminophen (NORCO) 7.5-325 MG per tablet Take 1 tablet by mouth every 4 (four) hours as needed for moderate pain.     Marland Kitchen levothyroxine (SYNTHROID, LEVOTHROID) 112 MCG tablet Take 112 mcg by mouth daily before breakfast.    . Multiple Vitamin (MULTIVITAMIN) capsule Take 1 capsule by mouth daily.      . ramipril (ALTACE) 10 MG capsule TAKE 1 CAPSULE TWICE A DAY 180 capsule 1  . temazepam (RESTORIL) 15 MG capsule Take 15 mg by mouth at bedtime as needed for sleep.     . vitamin C (ASCORBIC ACID) 500 MG tablet Take 500 mg by mouth daily.      . VOLTAREN 1 % GEL Take 1 application by mouth at bedtime as needed (for knee pain).     Marland Kitchen atorvastatin (LIPITOR) 20 MG tablet TAKE 1/2 TABLET BY MOUTH ONCE DAILY     No current facility-administered medications for this visit.    Allergies:   Aspirin    Social History:  The patient  reports that she has never smoked. She does not have any smokeless tobacco history on file. She reports that she drinks alcohol. She reports that she does not use  illicit drugs.   Family History:  The patient's family history includes Cancer in her brother; Emphysema in her father; Hypertension in her mother.    ROS:  Please see the history of present illness.   Otherwise, review of systems are positive for none.   All other systems are reviewed and negative.    PHYSICAL EXAM: VS:  BP 110/58 mmHg  Pulse 77  Ht 5' 3"  (1.6 m)  Wt 173 lb 3.2 oz (78.563 kg)  BMI 30.69 kg/m2  SpO2 98% , BMI Body mass index is 30.69 kg/(m^2). GEN: Well nourished, well developed, in no acute distress HEENT: normal Neck: no JVD, carotid bruits, or masses Cardiac: RRR; no murmurs, rubs, or gallops,no edema  Respiratory:  clear to auscultation bilaterally, normal work of breathing GI: soft, nontender, nondistended, + BS MS: no deformity or atrophy Skin: warm and dry, no rash Neuro:  Strength and sensation are intact Psych: euthymic mood, full affect   EKG:  EKG is not ordered today.    Recent Labs: 06/12/2014: ALT 15 07/08/2014: BUN 29*; Creatinine, Ser 0.84; Hemoglobin 14.3; Platelets 197.0; Potassium 4.9; Sodium 140    Lipid Panel    Component Value Date/Time   CHOL 161 06/12/2014 0920   TRIG 119.0 06/12/2014 0920   HDL 45.80 06/12/2014 0920   CHOLHDL 4 06/12/2014 0920   VLDL 23.8 06/12/2014 0920   LDLCALC 91 06/12/2014 0920      Wt Readings from Last 3 Encounters:  01/11/15 173 lb 3.2 oz (78.563 kg)  07/20/14 162 lb (73.483 kg)  07/08/14 169 lb (76.658 kg)     ASSESSMENT AND PLAN:  1. Idiopathic DCM now with normalized EF 50-55%  2. HTN - BP controlled - continue Ramipril/carvedilol/amlodipine - check BMET  3. Dyslipidemia - LDL 91 - continue Lipitor  4. GERD - not on PPI due to history of throat swelling 5.  Flashing lights that occurred when she awakened from sleep - ? Atypical migraine or actually dreaming and awakened- no further episodes.  I have recommended that she see her opthalmologist.   Current medicines are reviewed at  length with the patient today.  The patient does not have concerns regarding medicines.  The following changes have been made:  no change  Labs/ tests ordered today: See above Assessment and  Plan No orders of the defined types were placed in this encounter.     Disposition:   FU with me in 6 months  Signed, Sueanne Margarita, MD  01/11/2015 11:12 AM    [facility] [address], [place], [place]  [address] Phone: [phone]; Fax: [phone]

## 2015-03-23 ENCOUNTER — Other Ambulatory Visit: Payer: Self-pay | Admitting: Cardiology

## 2015-05-04 ENCOUNTER — Other Ambulatory Visit: Payer: Self-pay | Admitting: Cardiology

## 2015-05-06 ENCOUNTER — Encounter: Payer: Self-pay | Admitting: Cardiology

## 2015-05-19 DIAGNOSIS — N3281 Overactive bladder: Secondary | ICD-10-CM | POA: Diagnosis not present

## 2015-05-19 DIAGNOSIS — G4700 Insomnia, unspecified: Secondary | ICD-10-CM | POA: Diagnosis not present

## 2015-05-19 DIAGNOSIS — M170 Bilateral primary osteoarthritis of knee: Secondary | ICD-10-CM | POA: Diagnosis not present

## 2015-05-19 DIAGNOSIS — Z23 Encounter for immunization: Secondary | ICD-10-CM | POA: Diagnosis not present

## 2015-06-24 DIAGNOSIS — M170 Bilateral primary osteoarthritis of knee: Secondary | ICD-10-CM | POA: Diagnosis not present

## 2015-06-24 DIAGNOSIS — M25562 Pain in left knee: Secondary | ICD-10-CM | POA: Diagnosis not present

## 2015-06-24 DIAGNOSIS — M25561 Pain in right knee: Secondary | ICD-10-CM | POA: Diagnosis not present

## 2015-06-24 DIAGNOSIS — R262 Difficulty in walking, not elsewhere classified: Secondary | ICD-10-CM | POA: Diagnosis not present

## 2015-06-30 DIAGNOSIS — M25561 Pain in right knee: Secondary | ICD-10-CM | POA: Diagnosis not present

## 2015-06-30 DIAGNOSIS — R2689 Other abnormalities of gait and mobility: Secondary | ICD-10-CM | POA: Diagnosis not present

## 2015-06-30 DIAGNOSIS — M25562 Pain in left knee: Secondary | ICD-10-CM | POA: Diagnosis not present

## 2015-06-30 DIAGNOSIS — M170 Bilateral primary osteoarthritis of knee: Secondary | ICD-10-CM | POA: Diagnosis not present

## 2015-06-30 DIAGNOSIS — M1711 Unilateral primary osteoarthritis, right knee: Secondary | ICD-10-CM | POA: Diagnosis not present

## 2015-07-06 DIAGNOSIS — M170 Bilateral primary osteoarthritis of knee: Secondary | ICD-10-CM | POA: Diagnosis not present

## 2015-07-06 DIAGNOSIS — M1712 Unilateral primary osteoarthritis, left knee: Secondary | ICD-10-CM | POA: Diagnosis not present

## 2015-07-06 DIAGNOSIS — R2689 Other abnormalities of gait and mobility: Secondary | ICD-10-CM | POA: Diagnosis not present

## 2015-07-06 DIAGNOSIS — M25561 Pain in right knee: Secondary | ICD-10-CM | POA: Diagnosis not present

## 2015-07-06 DIAGNOSIS — M25562 Pain in left knee: Secondary | ICD-10-CM | POA: Diagnosis not present

## 2015-07-13 DIAGNOSIS — M170 Bilateral primary osteoarthritis of knee: Secondary | ICD-10-CM | POA: Diagnosis not present

## 2015-07-13 DIAGNOSIS — M1711 Unilateral primary osteoarthritis, right knee: Secondary | ICD-10-CM | POA: Diagnosis not present

## 2015-07-13 DIAGNOSIS — M25562 Pain in left knee: Secondary | ICD-10-CM | POA: Diagnosis not present

## 2015-07-13 DIAGNOSIS — R2689 Other abnormalities of gait and mobility: Secondary | ICD-10-CM | POA: Diagnosis not present

## 2015-07-13 DIAGNOSIS — M25561 Pain in right knee: Secondary | ICD-10-CM | POA: Diagnosis not present

## 2015-07-20 DIAGNOSIS — M25561 Pain in right knee: Secondary | ICD-10-CM | POA: Diagnosis not present

## 2015-07-20 DIAGNOSIS — M25562 Pain in left knee: Secondary | ICD-10-CM | POA: Diagnosis not present

## 2015-07-20 DIAGNOSIS — M170 Bilateral primary osteoarthritis of knee: Secondary | ICD-10-CM | POA: Diagnosis not present

## 2015-07-20 DIAGNOSIS — R2689 Other abnormalities of gait and mobility: Secondary | ICD-10-CM | POA: Diagnosis not present

## 2015-07-20 DIAGNOSIS — M1712 Unilateral primary osteoarthritis, left knee: Secondary | ICD-10-CM | POA: Diagnosis not present

## 2015-07-27 DIAGNOSIS — R2689 Other abnormalities of gait and mobility: Secondary | ICD-10-CM | POA: Diagnosis not present

## 2015-07-27 DIAGNOSIS — M1711 Unilateral primary osteoarthritis, right knee: Secondary | ICD-10-CM | POA: Diagnosis not present

## 2015-07-27 DIAGNOSIS — M25562 Pain in left knee: Secondary | ICD-10-CM | POA: Diagnosis not present

## 2015-07-27 DIAGNOSIS — M25561 Pain in right knee: Secondary | ICD-10-CM | POA: Diagnosis not present

## 2015-07-27 DIAGNOSIS — M170 Bilateral primary osteoarthritis of knee: Secondary | ICD-10-CM | POA: Diagnosis not present

## 2015-08-01 ENCOUNTER — Other Ambulatory Visit: Payer: Self-pay | Admitting: Cardiology

## 2015-08-02 DIAGNOSIS — R2689 Other abnormalities of gait and mobility: Secondary | ICD-10-CM | POA: Diagnosis not present

## 2015-08-02 DIAGNOSIS — M1712 Unilateral primary osteoarthritis, left knee: Secondary | ICD-10-CM | POA: Diagnosis not present

## 2015-08-02 DIAGNOSIS — M25561 Pain in right knee: Secondary | ICD-10-CM | POA: Diagnosis not present

## 2015-08-02 DIAGNOSIS — M25562 Pain in left knee: Secondary | ICD-10-CM | POA: Diagnosis not present

## 2015-08-02 DIAGNOSIS — M170 Bilateral primary osteoarthritis of knee: Secondary | ICD-10-CM | POA: Diagnosis not present

## 2015-08-04 DIAGNOSIS — Z85850 Personal history of malignant neoplasm of thyroid: Secondary | ICD-10-CM | POA: Diagnosis not present

## 2015-08-04 DIAGNOSIS — E890 Postprocedural hypothyroidism: Secondary | ICD-10-CM | POA: Diagnosis not present

## 2015-08-09 DIAGNOSIS — M1711 Unilateral primary osteoarthritis, right knee: Secondary | ICD-10-CM | POA: Diagnosis not present

## 2015-08-09 DIAGNOSIS — M25561 Pain in right knee: Secondary | ICD-10-CM | POA: Diagnosis not present

## 2015-08-17 DIAGNOSIS — R2689 Other abnormalities of gait and mobility: Secondary | ICD-10-CM | POA: Diagnosis not present

## 2015-08-17 DIAGNOSIS — M170 Bilateral primary osteoarthritis of knee: Secondary | ICD-10-CM | POA: Diagnosis not present

## 2015-08-17 DIAGNOSIS — M25562 Pain in left knee: Secondary | ICD-10-CM | POA: Diagnosis not present

## 2015-08-17 DIAGNOSIS — M25561 Pain in right knee: Secondary | ICD-10-CM | POA: Diagnosis not present

## 2015-08-17 DIAGNOSIS — M1712 Unilateral primary osteoarthritis, left knee: Secondary | ICD-10-CM | POA: Diagnosis not present

## 2015-08-31 NOTE — Progress Notes (Signed)
Cardiology Office Note   Date:  09/01/2015   ID:  [name], DOB [birth year], MRN [id number]  PCP:  Beatris Si    Chief Complaint  Patient presents with  . Cardiomyopathy  . Hypertension      History of Present Illness: [name] is a 80 y.o. female with a history of idiopathic DCM now with EF 70%, HTN and chronic SOB due to GERD who presents today for followup. She is doing well. She denies any LE edema, palpitations or syncope. She has chronic SOB which has been felt to be due to GERD in the past but her SOB has gotten worse. She has atypical CP that she has had for some time and stress test in the past was normal and is was felt to be due to her GERD. She had a normal cath in 2006. She has not had any further CP.  She has no dizziness unless getting up too fast.    Past Medical History  Diagnosis Date  . GERD (gastroesophageal reflux disease)   . Ankle fracture   . Dyslipidemia   . Seasonal allergies   . Overactive bladder   . Diverticula, colon   . Osteoarthritis     both knees  . Fecal incontinence     mild  . Depression   . Pulmonary nodule   . Colitis   . DCM (dilated cardiomyopathy) ([place])     idiopathic now with EF 70% by echo and nuclear stress test  . Hypertension   . Thyroid nodule     papillary cancer  . Post-surgical hypothyroidism   . AICD (automatic cardioverter/defibrillator) present     Past Surgical History  Procedure Laterality Date  . Thyroidectomy  03/29/2009    papillary cancer  . Broken ankle    . Tore knee    . Cardiac defibrillator placement    . Abdominal hysterectomy    . Tonsillectomy    . Nose surgery  1987  . Total abdominal hysterectomy w/ bilateral salpingoophorectomy  1988  . Tonsilectomy, adenoidectomy, bilateral myringotomy and tubes  1956  . Knee surgery  01/2002    both  . Left ankle surgery  08/1999  . Generator removal N/A 07/20/2014    Procedure: ICD GENERATOR REMOVAL;   Surgeon: Evans Lance, MD;  Location: [facility] CATH LAB;  Service: Cardiovascular;  Laterality: N/A;     Current Outpatient Prescriptions  Medication Sig Dispense Refill  . acetaminophen (TYLENOL) 500 MG tablet Take 1,000 mg by mouth every 6 (six) hours as needed for moderate pain.    Marland Kitchen amLODipine (NORVASC) 2.5 MG tablet TAKE 1 TABLET (2.5 MG TOTAL) BY MOUTH DAILY. 90 tablet 1  . aspirin 81 MG tablet Take 81 mg by mouth daily.      Marland Kitchen azelastine (ASTELIN) 137 MCG/SPRAY nasal spray Place 1 spray into the nose as needed. Use in each nostril as directed     . carvedilol (COREG) 25 MG tablet Take 1 tablet (25 mg total) by mouth 2 (two) times daily with a meal. 180 tablet 1  . DULoxetine (CYMBALTA) 30 MG capsule Take 30 mg by mouth daily.    Marland Kitchen levothyroxine (SYNTHROID, LEVOTHROID) 112 MCG tablet Take 112 mcg by mouth daily before breakfast.    . Multiple Vitamin (MULTIVITAMIN) capsule Take 1 capsule by mouth daily.      . ramipril (ALTACE) 10 MG capsule Take  1 capsule (10 mg total) by mouth 2 (two) times daily. 180 capsule 1  . temazepam (RESTORIL) 15 MG capsule Take 15 mg by mouth at bedtime as needed for sleep.     . vitamin C (ASCORBIC ACID) 500 MG tablet Take 500 mg by mouth daily.      . fish oil-omega-3 fatty acids 1000 MG capsule Take 2 g by mouth daily.      . VOLTAREN 1 % GEL Apply 1 application topically at bedtime as needed (for knee pain).      No current facility-administered medications for this visit.    Allergies:   Aspirin    Social History:  The patient  reports that she has never smoked. She does not have any smokeless tobacco history on file. She reports that she drinks alcohol. She reports that she does not use illicit drugs.   Family History:  The patient's family history includes Cancer in her brother; Emphysema in her father; Hypertension in her mother.    ROS:  Please see the history of present illness.   Otherwise, review of systems are positive for none.   All other  systems are reviewed and negative.    PHYSICAL EXAM: VS:  BP 120/72 mmHg  Pulse 72  Ht 5' 3.5" (1.613 m)  Wt 179 lb (81.194 kg)  BMI 31.21 kg/m2 , BMI Body mass index is 31.21 kg/(m^2). GEN: Well nourished, well developed, in no acute distress HEENT: normal Neck: no JVD, carotid bruits, or masses Cardiac: RRR; no murmurs, rubs, or gallops,no edema  Respiratory:  clear to auscultation bilaterally, normal work of breathing GI: soft, nontender, nondistended, + BS MS: no deformity or atrophy Skin: warm and dry, no rash Neuro:  Strength and sensation are intact Psych: euthymic mood, full affect   EKG:  EKG was ordered today showing NSR with IVCD    Recent Labs: No results found for requested labs within last 365 days.    Lipid Panel    Component Value Date/Time   CHOL 161 06/12/2014 0920   TRIG 119.0 06/12/2014 0920   HDL 45.80 06/12/2014 0920   CHOLHDL 4 06/12/2014 0920   VLDL 23.8 06/12/2014 0920   LDLCALC 91 06/12/2014 0920      Wt Readings from Last 3 Encounters:  09/01/15 179 lb (81.194 kg)  01/11/15 173 lb 3.2 oz (78.563 kg)  07/20/14 162 lb (73.483 kg)    ASSESSMENT AND PLAN:  1. Idiopathic DCM now with normalized EF 50-55%  2. HTN - BP controlled - continue Ramipril/carvedilol/amlodipine - check BMET  3. Dyslipidemia - LDL 91 (06/2014) - continue Lipitor  - check FLP and ALT 4. GERD - not on PPI due to history of throat swelling 5.  SOB of ? Etiology.  I will check a BNP and 2D echo.  She does not appear volume overloaded on exam.She has had some hoarseness and question whether this could be related to allergies.    Current medicines are reviewed at length with the patient today.  The patient does not have concerns regarding medicines.  The following changes have been made:  no change  Labs/ tests ordered today: See above Assessment and Plan No orders of the defined types were placed in this encounter.     Disposition:   FU with me in 1  year  Signed, Sueanne Margarita, MD  09/01/2015 2:16 PM    [facility] [place], [place],   [address] Phone: [phone]; Fax: [phone]    

## 2015-09-01 ENCOUNTER — Ambulatory Visit (INDEPENDENT_AMBULATORY_CARE_PROVIDER_SITE_OTHER): Payer: Medicare Other | Admitting: Cardiology

## 2015-09-01 ENCOUNTER — Ambulatory Visit
Admission: RE | Admit: 2015-09-01 | Discharge: 2015-09-01 | Disposition: A | Discharge status: Home / Self Care | Payer: Medicare Other | Source: Ambulatory Visit | Attending: Cardiology | Admitting: Cardiology

## 2015-09-01 ENCOUNTER — Encounter: Payer: Self-pay | Admitting: Cardiology

## 2015-09-01 VITALS — BP 120/72 | HR 72 | Ht 63.5 in | Wt 179.0 lb

## 2015-09-01 DIAGNOSIS — I10 Essential (primary) hypertension: Secondary | ICD-10-CM

## 2015-09-01 DIAGNOSIS — R0602 Shortness of breath: Secondary | ICD-10-CM

## 2015-09-01 DIAGNOSIS — I420 Dilated cardiomyopathy: Secondary | ICD-10-CM | POA: Diagnosis not present

## 2015-09-01 DIAGNOSIS — E785 Hyperlipidemia, unspecified: Secondary | ICD-10-CM | POA: Diagnosis not present

## 2015-09-01 LAB — BASIC METABOLIC PANEL
BUN: 25 mg/dL (ref 7–25)
CHLORIDE: 103 mmol/L (ref 98–110)
CO2: 27 mmol/L (ref 20–31)
Calcium: 9.6 mg/dL (ref 8.6–10.4)
Creat: 0.85 mg/dL (ref 0.60–0.93)
Glucose, Bld: 86 mg/dL (ref 65–99)
POTASSIUM: 4.6 mmol/L (ref 3.5–5.3)
Sodium: 139 mmol/L (ref 135–146)

## 2015-09-01 NOTE — Patient Instructions (Signed)
Medication Instructions:  Your physician recommends that you continue on your current medications as directed. Please refer to the Current Medication list given to you today.   Labwork: TODAY: BMET, BNP  Your physician recommends that you return for FASTING lab work. (LFTs, Lipids)  Testing/Procedures: Your physician has requested that you have an echocardiogram. Echocardiography is a painless test that uses sound waves to create images of your heart. It provides your doctor with information about the size and shape of your heart and how well your heart's chambers and valves are working. This procedure takes approximately one hour. There are no restrictions for this procedure.   Dr. Radford Pax recommends you have a CHEST X RAY.  Follow-Up: Your physician wants you to follow-up in: 1 year with Dr. Radford Pax. You will receive a reminder letter in the mail two months in advance. If you don't receive a letter, please call our office to schedule the follow-up appointment.   Any Other Special Instructions Will Be Listed Below (If Applicable).     If you need a refill on your cardiac medications before your next appointment, please call your pharmacy.

## 2015-09-02 LAB — BRAIN NATRIURETIC PEPTIDE: BRAIN NATRIURETIC PEPTIDE: 22.1 pg/mL (ref <100)

## 2015-09-17 ENCOUNTER — Ambulatory Visit (HOSPITAL_COMMUNITY): Payer: Medicare Other | Attending: Cardiovascular Disease

## 2015-09-17 ENCOUNTER — Other Ambulatory Visit: Payer: Self-pay | Admitting: Cardiology

## 2015-09-17 ENCOUNTER — Other Ambulatory Visit: Payer: Self-pay

## 2015-09-17 ENCOUNTER — Other Ambulatory Visit (INDEPENDENT_AMBULATORY_CARE_PROVIDER_SITE_OTHER): Payer: Medicare Other | Admitting: *Deleted

## 2015-09-17 DIAGNOSIS — Z6831 Body mass index (BMI) 31.0-31.9, adult: Secondary | ICD-10-CM | POA: Insufficient documentation

## 2015-09-17 DIAGNOSIS — I340 Nonrheumatic mitral (valve) insufficiency: Secondary | ICD-10-CM | POA: Insufficient documentation

## 2015-09-17 DIAGNOSIS — E669 Obesity, unspecified: Secondary | ICD-10-CM | POA: Insufficient documentation

## 2015-09-17 DIAGNOSIS — R0602 Shortness of breath: Secondary | ICD-10-CM | POA: Diagnosis not present

## 2015-09-17 DIAGNOSIS — R0600 Dyspnea, unspecified: Secondary | ICD-10-CM | POA: Diagnosis present

## 2015-09-17 DIAGNOSIS — I10 Essential (primary) hypertension: Secondary | ICD-10-CM | POA: Diagnosis not present

## 2015-09-17 DIAGNOSIS — E785 Hyperlipidemia, unspecified: Secondary | ICD-10-CM | POA: Diagnosis not present

## 2015-09-17 DIAGNOSIS — I420 Dilated cardiomyopathy: Secondary | ICD-10-CM | POA: Diagnosis not present

## 2015-09-17 LAB — LIPID PANEL
CHOL/HDL RATIO: 3.4 ratio (ref <=5.0)
CHOLESTEROL: 213 mg/dL — AB (ref 125–200)
HDL: 63 mg/dL (ref >=46)
LDL CALC: 133 mg/dL — AB (ref <130)
TRIGLYCERIDES: 84 mg/dL (ref <150)
VLDL: 17 mg/dL (ref <30)

## 2015-09-17 LAB — HEPATIC FUNCTION PANEL
ALBUMIN: 4.1 g/dL (ref 3.6–5.1)
ALT: 13 U/L (ref 6–29)
AST: 17 U/L (ref 10–35)
Alkaline Phosphatase: 52 U/L (ref 33–130)
BILIRUBIN INDIRECT: 0.6 mg/dL (ref 0.2–1.2)
Bilirubin, Direct: 0.1 mg/dL (ref <=0.2)
TOTAL PROTEIN: 6.4 g/dL (ref 6.1–8.1)
Total Bilirubin: 0.7 mg/dL (ref 0.2–1.2)

## 2015-09-19 ENCOUNTER — Other Ambulatory Visit: Payer: Self-pay | Admitting: Cardiology

## 2015-09-20 ENCOUNTER — Telehealth: Payer: Self-pay

## 2015-09-20 DIAGNOSIS — R0602 Shortness of breath: Secondary | ICD-10-CM

## 2015-09-20 DIAGNOSIS — I420 Dilated cardiomyopathy: Secondary | ICD-10-CM

## 2015-09-20 NOTE — Telephone Encounter (Signed)
Informed patient of results and verbal understanding expressed.  Repeat ECHO ordered to be scheduled in 1 year. Patient agrees with treatment plan. 

## 2015-09-20 NOTE — Telephone Encounter (Signed)
-----   Message from Sueanne Margarita, MD sent at 09/19/2015  8:47 AM EDT ----- Echo showed normal LVF with increased stiffness of heart muscle, mild to moderate MR - repeat echo in 1 year

## 2015-09-24 ENCOUNTER — Telehealth: Payer: Self-pay | Admitting: Cardiology

## 2015-09-24 ENCOUNTER — Other Ambulatory Visit: Payer: Self-pay | Admitting: *Deleted

## 2015-09-24 DIAGNOSIS — E785 Hyperlipidemia, unspecified: Secondary | ICD-10-CM

## 2015-09-24 MED ORDER — ROSUVASTATIN CALCIUM 5 MG PO TABS: 5.0000 mg | ORAL_TABLET | Freq: Every day | ORAL | Status: DC

## 2015-09-24 NOTE — Telephone Encounter (Signed)
New message      Returning a call to the nurse to get test results.  Ok to call back on monday

## 2015-09-24 NOTE — Telephone Encounter (Signed)
Discussed recent lab results with patient.

## 2015-09-27 ENCOUNTER — Telehealth: Payer: Self-pay

## 2015-09-27 DIAGNOSIS — R0602 Shortness of breath: Secondary | ICD-10-CM

## 2015-09-27 DIAGNOSIS — I4891 Unspecified atrial fibrillation: Secondary | ICD-10-CM

## 2015-09-27 NOTE — Telephone Encounter (Signed)
-----   Message from Sueanne Margarita, MD sent at 09/20/2015  9:48 PM EDT ----- Have patient come in for TSH and CBC.  Please get a Lexiscan myoview to rule out ischemia.

## 2015-09-27 NOTE — Telephone Encounter (Signed)
Informed patient of Dr. Theodosia Blender recommendations to have [place] and blood work. Reviewed instructions for stress test to patient's satisfaction. Lexiscan ordered for scheduling. Patient understands she will have labs drawn the same day. Patient was grateful for call.

## 2015-09-30 ENCOUNTER — Telehealth (HOSPITAL_COMMUNITY): Payer: Self-pay | Admitting: *Deleted

## 2015-09-30 NOTE — Telephone Encounter (Signed)
Left message on voicemail in reference to upcoming appointment scheduled for 10/05/15  Phone number given for a call back so details instructions can be given. Hubbard Robinson, RN

## 2015-10-04 DIAGNOSIS — E890 Postprocedural hypothyroidism: Secondary | ICD-10-CM | POA: Diagnosis not present

## 2015-10-04 DIAGNOSIS — Z85850 Personal history of malignant neoplasm of thyroid: Secondary | ICD-10-CM | POA: Diagnosis not present

## 2015-10-05 ENCOUNTER — Ambulatory Visit (HOSPITAL_COMMUNITY): Payer: Medicare Other | Attending: Internal Medicine

## 2015-10-05 ENCOUNTER — Other Ambulatory Visit (INDEPENDENT_AMBULATORY_CARE_PROVIDER_SITE_OTHER): Payer: Medicare Other | Admitting: *Deleted

## 2015-10-05 DIAGNOSIS — I10 Essential (primary) hypertension: Secondary | ICD-10-CM | POA: Diagnosis not present

## 2015-10-05 DIAGNOSIS — R079 Chest pain, unspecified: Secondary | ICD-10-CM | POA: Insufficient documentation

## 2015-10-05 DIAGNOSIS — I4891 Unspecified atrial fibrillation: Secondary | ICD-10-CM | POA: Diagnosis not present

## 2015-10-05 DIAGNOSIS — R0602 Shortness of breath: Secondary | ICD-10-CM

## 2015-10-05 DIAGNOSIS — E785 Hyperlipidemia, unspecified: Secondary | ICD-10-CM | POA: Diagnosis not present

## 2015-10-05 LAB — MYOCARDIAL PERFUSION IMAGING
CHL CUP NUCLEAR SDS: 2
CHL CUP NUCLEAR SSS: 8
CSEPPHR: 85 {beats}/min
LHR: 0.27
LVDIAVOL: 78 mL (ref 46–106)
LVSYSVOL: 30 mL
Rest HR: 59 {beats}/min
SRS: 6
TID: 1.09

## 2015-10-05 LAB — CBC WITH DIFFERENTIAL/PLATELET
Basophils Absolute: 100 cells/uL (ref 0–200)
Basophils Relative: 2 %
EOS ABS: 200 {cells}/uL (ref 15–500)
Eosinophils Relative: 4 %
HEMATOCRIT: 42.7 % (ref 35.0–45.0)
HEMOGLOBIN: 14.2 g/dL (ref 11.7–15.5)
LYMPHS ABS: 1500 {cells}/uL (ref 850–3900)
LYMPHS PCT: 30 %
MCH: 31.6 pg (ref 27.0–33.0)
MCHC: 33.3 g/dL (ref 32.0–36.0)
MCV: 95.1 fL (ref 80.0–100.0)
MONO ABS: 550 {cells}/uL (ref 200–950)
MPV: 10.5 fL (ref 7.5–12.5)
Monocytes Relative: 11 %
NEUTROS PCT: 53 %
Neutro Abs: 2650 cells/uL (ref 1500–7800)
Platelets: 234 10*3/uL (ref 140–400)
RBC: 4.49 MIL/uL (ref 3.80–5.10)
RDW: 13.2 % (ref 11.0–15.0)
WBC: 5 10*3/uL (ref 3.8–10.8)

## 2015-10-05 LAB — ALT: ALT: 12 U/L (ref 6–29)

## 2015-10-05 MED ORDER — REGADENOSON 0.4 MG/5ML IV SOLN
0.4000 mg | Freq: Once | INTRAVENOUS | Status: AC
  Administered 2015-10-05: 0.4 mg via INTRAVENOUS

## 2015-10-05 MED ORDER — TECHNETIUM TC 99M SESTAMIBI GENERIC - CARDIOLITE
32.5000 | Freq: Once | INTRAVENOUS | Status: AC | PRN
  Administered 2015-10-05: 33 via INTRAVENOUS

## 2015-10-05 MED ORDER — TECHNETIUM TC 99M SESTAMIBI GENERIC - CARDIOLITE
9.6000 | Freq: Once | INTRAVENOUS | Status: AC | PRN
  Administered 2015-10-05: 10 via INTRAVENOUS

## 2015-10-05 NOTE — Addendum Note (Signed)
Addended by: Velna Ochs on: 10/05/2015 09:08 AM   Modules accepted: Orders

## 2015-10-28 ENCOUNTER — Encounter: Payer: Self-pay | Admitting: Cardiology

## 2015-11-12 ENCOUNTER — Other Ambulatory Visit (INDEPENDENT_AMBULATORY_CARE_PROVIDER_SITE_OTHER): Payer: Medicare Other | Admitting: *Deleted

## 2015-11-12 DIAGNOSIS — E7800 Pure hypercholesterolemia, unspecified: Secondary | ICD-10-CM | POA: Diagnosis not present

## 2015-11-12 LAB — LIPID PANEL
Cholesterol: 145 mg/dL (ref 125–200)
HDL: 51 mg/dL (ref >=46)
LDL Cholesterol: 75 mg/dL (ref <130)
Total CHOL/HDL Ratio: 2.8 Ratio (ref <=5.0)
Triglycerides: 95 mg/dL (ref <150)
VLDL: 19 mg/dL (ref <30)

## 2015-11-12 LAB — ALT: ALT: 11 U/L (ref 6–29)

## 2015-12-03 DIAGNOSIS — N3281 Overactive bladder: Secondary | ICD-10-CM | POA: Diagnosis not present

## 2015-12-03 DIAGNOSIS — M170 Bilateral primary osteoarthritis of knee: Secondary | ICD-10-CM | POA: Diagnosis not present

## 2015-12-03 DIAGNOSIS — R0602 Shortness of breath: Secondary | ICD-10-CM | POA: Diagnosis not present

## 2016-01-28 ENCOUNTER — Other Ambulatory Visit: Payer: Self-pay | Admitting: Cardiology

## 2016-03-20 DIAGNOSIS — M170 Bilateral primary osteoarthritis of knee: Secondary | ICD-10-CM | POA: Diagnosis not present

## 2016-05-17 ENCOUNTER — Other Ambulatory Visit: Payer: Self-pay | Admitting: Gastroenterology

## 2016-05-17 DIAGNOSIS — R1031 Right lower quadrant pain: Secondary | ICD-10-CM

## 2016-05-17 DIAGNOSIS — K921 Melena: Secondary | ICD-10-CM

## 2016-05-18 ENCOUNTER — Ambulatory Visit
Admission: RE | Admit: 2016-05-18 | Discharge: 2016-05-18 | Disposition: A | Discharge status: Home / Self Care | Payer: Medicare Other | Source: Ambulatory Visit | Attending: Gastroenterology | Admitting: Gastroenterology

## 2016-05-18 DIAGNOSIS — R1031 Right lower quadrant pain: Secondary | ICD-10-CM

## 2016-05-18 DIAGNOSIS — K921 Melena: Secondary | ICD-10-CM

## 2016-05-18 DIAGNOSIS — K5790 Diverticulosis of intestine, part unspecified, without perforation or abscess without bleeding: Secondary | ICD-10-CM | POA: Diagnosis not present

## 2016-05-18 MED ORDER — IOPAMIDOL (ISOVUE-300) INJECTION 61%
100.0000 mL | Freq: Once | INTRAVENOUS | Status: AC | PRN
  Administered 2016-05-18: 100 mL via INTRAVENOUS

## 2016-06-02 DIAGNOSIS — M170 Bilateral primary osteoarthritis of knee: Secondary | ICD-10-CM | POA: Diagnosis not present

## 2016-06-02 DIAGNOSIS — R05 Cough: Secondary | ICD-10-CM | POA: Diagnosis not present

## 2016-06-02 DIAGNOSIS — R202 Paresthesia of skin: Secondary | ICD-10-CM | POA: Diagnosis not present

## 2016-06-02 DIAGNOSIS — L309 Dermatitis, unspecified: Secondary | ICD-10-CM | POA: Diagnosis not present

## 2016-06-15 DIAGNOSIS — Z86010 Personal history of colonic polyps: Secondary | ICD-10-CM | POA: Diagnosis not present

## 2016-06-15 DIAGNOSIS — K6389 Other specified diseases of intestine: Secondary | ICD-10-CM | POA: Diagnosis not present

## 2016-06-15 DIAGNOSIS — K5730 Diverticulosis of large intestine without perforation or abscess without bleeding: Secondary | ICD-10-CM | POA: Diagnosis not present

## 2016-06-20 DIAGNOSIS — K6389 Other specified diseases of intestine: Secondary | ICD-10-CM | POA: Diagnosis not present

## 2016-06-22 IMAGING — NM NM MISC PROCEDURE
3 series · 18 of 18 positions shown · non-contrast
Comparison: none

[Series 1: wbr_s-proj_st stress_(id)_sa · 6.5mm · 6.51mm/px · 6 of 512 frames shown (1 of 2)]
[frame 43/512]
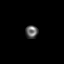
[frame 128/512]
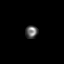
[frame 214/512]
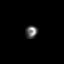
[frame 299/512]
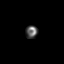
[frame 384/512]
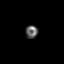
[frame 470/512]
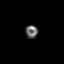

[Series 1: wbr_s-proj_st stress_(id)_sa · 6.5mm · 6.51mm/px · 6 of 64 frames shown (2 of 2)]
[frame 6/64]
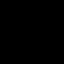
[frame 16/64]
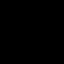
[frame 27/64]
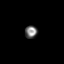
[frame 38/64]
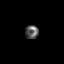
[frame 48/64]
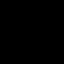
[frame 59/64]
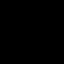

[Series 1: wbr_r-proj_st rest_(id)_sa · 6.5mm · 6.51mm/px · 6 of 64 frames shown]
[frame 6/64]
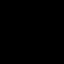
[frame 16/64]
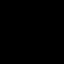
[frame 27/64]
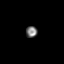
[frame 38/64]
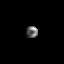
[frame 48/64]
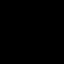
[frame 59/64]
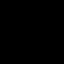

[18 of 18 positions shown; findings below may reference images not displayed]

Canned report from images found in remote index.

Refer to host system for actual result text.

## 2016-09-19 ENCOUNTER — Encounter: Payer: Self-pay | Admitting: Cardiology

## 2016-09-27 ENCOUNTER — Other Ambulatory Visit: Payer: Self-pay | Admitting: Cardiology

## 2016-10-04 DIAGNOSIS — Z85850 Personal history of malignant neoplasm of thyroid: Secondary | ICD-10-CM | POA: Diagnosis not present

## 2016-10-04 DIAGNOSIS — E890 Postprocedural hypothyroidism: Secondary | ICD-10-CM | POA: Diagnosis not present

## 2016-10-06 DIAGNOSIS — R490 Dysphonia: Secondary | ICD-10-CM | POA: Diagnosis not present

## 2016-10-06 DIAGNOSIS — R682 Dry mouth, unspecified: Secondary | ICD-10-CM | POA: Diagnosis not present

## 2016-10-06 DIAGNOSIS — E890 Postprocedural hypothyroidism: Secondary | ICD-10-CM | POA: Diagnosis not present

## 2016-10-06 DIAGNOSIS — Z85850 Personal history of malignant neoplasm of thyroid: Secondary | ICD-10-CM | POA: Diagnosis not present

## 2016-11-22 ENCOUNTER — Ambulatory Visit (HOSPITAL_COMMUNITY): Payer: Medicare Other | Attending: Internal Medicine

## 2016-11-22 ENCOUNTER — Other Ambulatory Visit: Payer: Self-pay

## 2016-11-22 DIAGNOSIS — I420 Dilated cardiomyopathy: Secondary | ICD-10-CM | POA: Diagnosis not present

## 2016-11-22 DIAGNOSIS — I083 Combined rheumatic disorders of mitral, aortic and tricuspid valves: Secondary | ICD-10-CM | POA: Insufficient documentation

## 2016-11-22 DIAGNOSIS — Z95810 Presence of automatic (implantable) cardiac defibrillator: Secondary | ICD-10-CM | POA: Diagnosis not present

## 2016-11-22 DIAGNOSIS — R0602 Shortness of breath: Secondary | ICD-10-CM | POA: Diagnosis not present

## 2016-11-23 ENCOUNTER — Other Ambulatory Visit: Payer: Self-pay

## 2016-11-23 ENCOUNTER — Encounter: Payer: Self-pay | Admitting: Physician Assistant

## 2016-11-23 MED ORDER — AMLODIPINE BESYLATE 2.5 MG PO TABS: 2.5000 mg | ORAL_TABLET | Freq: Every day | ORAL | 0 refills | Status: DC

## 2016-11-23 NOTE — Telephone Encounter (Signed)
New Message ° ° pt verbalized that she is returning call for rn °

## 2016-11-23 NOTE — Telephone Encounter (Signed)
This encounter was created in error - please disregard.

## 2016-11-29 ENCOUNTER — Ambulatory Visit (INDEPENDENT_AMBULATORY_CARE_PROVIDER_SITE_OTHER): Payer: Medicare Other | Admitting: Physician Assistant

## 2016-11-29 ENCOUNTER — Encounter: Payer: Self-pay | Admitting: Physician Assistant

## 2016-11-29 ENCOUNTER — Telehealth: Payer: Self-pay | Admitting: Physician Assistant

## 2016-11-29 VITALS — BP 132/58 | HR 60 | Ht 63.5 in | Wt 180.4 lb

## 2016-11-29 DIAGNOSIS — I351 Nonrheumatic aortic (valve) insufficiency: Secondary | ICD-10-CM

## 2016-11-29 DIAGNOSIS — E785 Hyperlipidemia, unspecified: Secondary | ICD-10-CM

## 2016-11-29 DIAGNOSIS — Z79899 Other long term (current) drug therapy: Secondary | ICD-10-CM

## 2016-11-29 DIAGNOSIS — R0602 Shortness of breath: Secondary | ICD-10-CM | POA: Diagnosis not present

## 2016-11-29 DIAGNOSIS — I420 Dilated cardiomyopathy: Secondary | ICD-10-CM | POA: Diagnosis not present

## 2016-11-29 DIAGNOSIS — I10 Essential (primary) hypertension: Secondary | ICD-10-CM | POA: Diagnosis not present

## 2016-11-29 DIAGNOSIS — I5189 Other ill-defined heart diseases: Secondary | ICD-10-CM

## 2016-11-29 DIAGNOSIS — I519 Heart disease, unspecified: Secondary | ICD-10-CM

## 2016-11-29 NOTE — Progress Notes (Signed)
Cardiology Office Note    Date:  11/29/2016  ID:  LANDA MULLINAX, DOB 1935-11-30, MRN [id number] PCP:  Corine Shelter, PA-C  Cardiologist:  Dr. Radford Pax   Chief Complaint: f/u cardiomyopathy  History of Present Illness:  [name] is a 81 y.o. female with history of prior idiopathic DCM with subsequent normalization of LV function, ICD placement with removal in 2395, diastolic dysfunction, HTN, chronic SOB, GERD, obesity, thyroid cancer, post-surgical hypothyroidism who presents to f/u echo.  Remote records reviewed - underwent cath in 07/2004 for chest pain, SOB, and abnormal EF by echo which showed normal coronaries but EF 20% with moderate MR. She underwent ICD implantation 11/2006. Due to normalization of LV function and device at [facility], this was removed in 2016. She last saw Dr. Radford Pax in 2017 for SOB at which time nuc was normal along with BNP. She has continued to have atypical chest pain for a long time, possibly attributed to GERD. Recent echo 11/22/16 showed moderate focal basal hypertrophy of the septum, EF 60-65%, grade 1 DD, mild AI, aortic sclerosis without stenosis, trivial MR. Most recent labs showed LDL 75 (11/2015), Hgb 14.2, BMET (10/2015).  She presents back for follow-up. She lives alone with several rescue cats and dogs. She had to put a 50 year old dog down over the winter and reports this was a very difficult event for her. She reports chronic left-sided abdominal pain for which no clear etiology has been determined by GI. She also had issues with a frozen shoulder. Her chest discomfort is very infrequent, nonexertional, no change for many years, lasts for several minutes then resolves spontaneously. She feels her dyspnea may be getting somewhat worse. She reports chronic knee pain for which she's been told she needs a replacement but says she can't because it would be very difficult trying to recover with only animals at home. She denies any syncope. No LEE, orthopnea, PND.  Reports daytime fatigue but also does not sleep well at all because she can't turn her brain off at night. She states she's being treated for fibromyalgia. Last bloodwork was about 2 months ago. She reports h/o remote pulmonary nodule for which she required follow-up but does not think she's had any further CT scans recently. Continues with chronic hoarseness that she states ENT could not find a cause for - previous providers have raised question of injury during thyroid surgery.   Past Medical History:  Diagnosis Date  . Ankle fracture   . Colitis   . DCM (dilated cardiomyopathy) ([place])    a. previously idiopathic with EF 20% in 2006 s/p ICD with subsequent normalization of EF and removal of defibrillator  . Depression   . Diastolic dysfunction   . Diverticula, colon   . Dyslipidemia   . Fecal incontinence    mild  . GERD (gastroesophageal reflux disease)   . Hypertension   . Mild aortic insufficiency   . Normal coronary arteries 2006  . Osteoarthritis    both knees  . Overactive bladder   . Post-surgical hypothyroidism   . Pulmonary nodule   . Seasonal allergies   . Thyroid nodule    papillary cancer    Past Surgical History:  Procedure Laterality Date  . ABDOMINAL HYSTERECTOMY    . BROKEN ANKLE    . CARDIAC DEFIBRILLATOR PLACEMENT    . GENERATOR REMOVAL N/A 07/20/2014   Procedure: ICD GENERATOR REMOVAL;  Surgeon: Evans Lance, MD;  Location: [facility] CATH LAB;  Service: Cardiovascular;  Laterality: N/A;  .  KNEE SURGERY  01/2002   both  . left ankle surgery  08/1999  . NOSE SURGERY  1987  . THYROIDECTOMY  03/29/2009   papillary cancer  . TONSILECTOMY, ADENOIDECTOMY, BILATERAL MYRINGOTOMY AND TUBES  1956  . TONSILLECTOMY    . TORE KNEE    . TOTAL ABDOMINAL HYSTERECTOMY W/ BILATERAL SALPINGOOPHORECTOMY  1988    Current Medications: Current Meds  Medication Sig  . acetaminophen (TYLENOL) 500 MG tablet Take 1,000 mg by mouth every 6 (six) hours as needed for moderate pain.    Marland Kitchen amLODipine (NORVASC) 2.5 MG tablet Take 1 tablet (2.5 mg total) by mouth daily.  Marland Kitchen aspirin 81 MG tablet Take 81 mg by mouth daily.    Marland Kitchen azelastine (ASTELIN) 137 MCG/SPRAY nasal spray Place 1 spray into the nose as directed. Use in each nostril as directed   . carvedilol (COREG) 25 MG tablet Take 25 mg by mouth 2 (two) times daily with a meal.  . DULoxetine (CYMBALTA) 30 MG capsule Take 30 mg by mouth daily.  . fish oil-omega-3 fatty acids 1000 MG capsule Take 2 g by mouth daily.    Marland Kitchen levothyroxine (SYNTHROID, LEVOTHROID) 112 MCG tablet Take 112 mcg by mouth daily before breakfast.  . mesalamine (LIALDA) 1.2 g EC tablet Take 1.2 g by mouth daily.  . Multiple Vitamin (MULTIVITAMIN) capsule Take 1 capsule by mouth daily.    . ramipril (ALTACE) 10 MG capsule Take 10 mg by mouth 2 (two) times daily.  . temazepam (RESTORIL) 15 MG capsule Take 15 mg by mouth at bedtime as needed for sleep.   . vitamin C (ASCORBIC ACID) 500 MG tablet Take 500 mg by mouth daily.    . VOLTAREN 1 % GEL Apply 1 application topically at bedtime as needed (for knee pain).      Allergies:   Aspirin   Social History   Social History  . Marital status: Divorced    Spouse name: N/A  . Number of children: N/A  . Years of education: N/A   Social History Main Topics  . Smoking status: Never Smoker  . Smokeless tobacco: Never Used  . Alcohol use Yes     Comment: rare  . Drug use: No  . Sexual activity: Not Asked   Other Topics Concern  . None   Social History Narrative  . None     Family History:  Family History  Problem Relation Age of Onset  . Emphysema Father   . Hypertension Mother   . Cancer Brother    ROS:   Please see the history of present illness.  All other systems are reviewed and otherwise negative.    PHYSICAL EXAM:   VS:  BP (!) 132/58   Pulse 60   Ht 5' 3.5" (1.613 m)   Wt 180 lb 6.4 oz (81.8 kg)   SpO2 98%   BMI 31.46 kg/m   BMI: Body mass index is 31.46 kg/m. GEN: Well  nourished, well developed obese WF, in no acute distress  HEENT: normocephalic, atraumatic Neck: no JVD, carotid bruits, or masses Cardiac: RRR; no murmurs, rubs, or gallops, no edema  Respiratory:  clear to auscultation bilaterally, normal work of breathing GI: soft, nontender, nondistended, + BS MS: no deformity or atrophy  Skin: warm and dry, no rash Neuro:  Alert and Oriented x 3, Strength and sensation are intact, follows commands Psych: euthymic mood, full affect  Wt Readings from Last 3 Encounters:  11/29/16 180 lb 6.4 oz (81.8 kg)  09/01/15 179 lb (81.2 kg)  01/11/15 173 lb 3.2 oz (78.6 kg)      Studies/Labs Reviewed:   EKG:  EKG was ordered today and personally reviewed by me and demonstrates NSR 62bpm LBBB similar to prior.  Recent Labs: No results found for requested labs within last 8760 hours.   Lipid Panel    Component Value Date/Time   CHOL 145 11/12/2015 0755   TRIG 95 11/12/2015 0755   HDL 51 11/12/2015 0755   CHOLHDL 2.8 11/12/2015 0755   VLDL 19 11/12/2015 0755   LDLCALC 75 11/12/2015 0755    Additional studies/ records that were reviewed today include: Summarized above.    ASSESSMENT & PLAN:   1. Prior dilated cardiomyopathy - recent LVEF normal. Overall cardiac symptomatology is stable and no new cardiac findings on exam. She has chronic persistent atypical CP that does not seem cardiac. Nuclear stress testing for this same symptom over the last 6 years has been reassuring.  2. Shortness of breath - persistent for several years. Negative nuc and normal BNP last year. 2D echo this year does not reveal any significant cause. She appears euvolemic. CBC last year for same symptom was negative. CXR did show chronic bronchitic changes and she reports remote h/o pulmonary nodule. Although I feel that a significant portion of her SOB is likely due to deconditioning in the setting of knee arthritis, inactivity and further weight gain I feel it is prudent to get  their input and she is agreeable. She is not tachycardic, tachypneic or hypoxic and has no signs of volume overload or DVT on exam. She reports she's gained weight over the past year from unhealthy eating choices like frequent ice cream. 3. Diastolic dysfunction without CHF - no evidence of HF on exam clinically. 4. Mild aortic insufficiency - follow clinically. Consider surveillance echo in 1-2 years. 5. HTN - BP appears upper limits of controlled. Given daytime fatigue and borderline low diastolic level, I am hesitant to further increase her medication at present time. We discussed possibility of proceeding with a sleep study but she did not really seem interested at this time. I told her to give it thought and let us know. 6. Dyslipidemia - she reports she had bloodwork at PCP's office 2 months ago. Will try to obtain and review. She is no longer on statin for unclear reasons. If cholesterol panel is not included in this bloodwork would recommend she return for fasting labs.   Disposition: F/u with Dr. Radford Pax in 6 months.  Medication Adjustments/Labs and Tests Ordered: Current medicines are reviewed at length with the patient today.  Concerns regarding medicines are outlined above. Medication changes, Labs and Tests ordered today are summarized above and listed in the Patient Instructions accessible in Encounters.   Signed, Charlie Pitter, PA-C  11/29/2016 3:37 PM    [facility] [address], [place], [place]  [address] Phone: [phone]; Fax: [phone]

## 2016-11-29 NOTE — Telephone Encounter (Signed)
Labs from PCP reviewed: 05/2016: Hgb 14.4, Plt 238, CMET stable except CO2 33.  10/2016: TSH 0.22.  Does not appear she's had the type of labwork we needed recently - she thought this was 2 months ago. Would recommend fasting lipid panel, and given her progressive SOB, would also repeat CMET and CBC. Please find out whether or not she is taking her statin any longer as it was removed from her med list this visit with a question mark beside it. Her lipid panel in 11/2015 looked great so ideally would want to continue the med she was on at that time. Also please make sure her endocrinologist did act upon decreased TSH as we do not have any notes regarding f/u plan for this level.  Dayna Dunn PA-C

## 2016-11-29 NOTE — Telephone Encounter (Signed)
Andi from Dr. Cindra Eves office calling, states that she received request for most recent labs. Andi states that most recent labs were from May and the only labs they have are a TSH and a Thyroglobulin and Andi will fax them over.

## 2016-11-29 NOTE — Patient Instructions (Addendum)
Medication Instructions:  Your physician recommends that you continue on your current medications as directed. Please refer to the Current Medication list given to you today.   Labwork: None ordered  Testing/Procedures: None ordered  You have been referred to [place].  They will contact you with an appt.  Follow-Up: Your physician recommends that you schedule a follow-up appointment in: [place]   Any Other Special Instructions Will Be Listed Below (If Applicable).    If you need a refill on your cardiac medications before your next appointment, please call your pharmacy.

## 2016-12-01 NOTE — Telephone Encounter (Signed)
She is already on thyroid med (levothyroxine) and a decreased level usually requires a dose adjustment downward- will defer to endocrinology.  Serena Petterson PA-C

## 2016-12-01 NOTE — Telephone Encounter (Signed)
Pt will come in Monday, 12/04/16, for fasting Lipid, CMET, and CBC.  She stated that her Endocrinologist did address the TSH level and he seemed it was fine and he didn't want her to have to start Synthroid, per pt.

## 2016-12-04 ENCOUNTER — Other Ambulatory Visit: Payer: Medicare Other | Admitting: *Deleted

## 2016-12-04 DIAGNOSIS — Z79899 Other long term (current) drug therapy: Secondary | ICD-10-CM | POA: Diagnosis not present

## 2016-12-05 ENCOUNTER — Telehealth: Payer: Self-pay | Admitting: Cardiology

## 2016-12-05 DIAGNOSIS — Z79899 Other long term (current) drug therapy: Secondary | ICD-10-CM

## 2016-12-05 LAB — COMPREHENSIVE METABOLIC PANEL
A/G RATIO: 1.6 (ref 1.2–2.2)
ALT: 14 IU/L (ref 0–32)
AST: 13 IU/L (ref 0–40)
Albumin: 4 g/dL (ref 3.5–4.8)
Alkaline Phosphatase: 67 IU/L (ref 39–117)
BILIRUBIN TOTAL: 0.5 mg/dL (ref 0.0–1.2)
BUN/Creatinine Ratio: 20 (ref 12–28)
BUN: 17 mg/dL (ref 8–27)
CALCIUM: 8.8 mg/dL (ref 8.7–10.3)
CHLORIDE: 106 mmol/L (ref 96–106)
CO2: 20 mmol/L (ref 20–29)
Creatinine, Ser: 0.85 mg/dL (ref 0.57–1.00)
GFR calc Af Amer: 76 mL/min/{1.73_m2} (ref >59)
GFR calc non Af Amer: 66 mL/min/{1.73_m2} (ref >59)
GLUCOSE: 113 mg/dL — AB (ref 65–99)
Globulin, Total: 2.5 g/dL (ref 1.5–4.5)
POTASSIUM: 4.5 mmol/L (ref 3.5–5.2)
Sodium: 142 mmol/L (ref 134–144)
TOTAL PROTEIN: 6.5 g/dL (ref 6.0–8.5)

## 2016-12-05 LAB — LIPID PANEL
CHOL/HDL RATIO: 3.6 ratio (ref 0.0–4.4)
Cholesterol, Total: 213 mg/dL — ABNORMAL HIGH (ref 100–199)
HDL: 59 mg/dL (ref >39)
LDL Calculated: 138 mg/dL — ABNORMAL HIGH (ref 0–99)
TRIGLYCERIDES: 81 mg/dL (ref 0–149)
VLDL Cholesterol Cal: 16 mg/dL (ref 5–40)

## 2016-12-05 LAB — CBC
HEMATOCRIT: 43.1 % (ref 34.0–46.6)
HEMOGLOBIN: 14.5 g/dL (ref 11.1–15.9)
MCH: 31.3 pg (ref 26.6–33.0)
MCHC: 33.6 g/dL (ref 31.5–35.7)
MCV: 93 fL (ref 79–97)
Platelets: 259 10*3/uL (ref 150–379)
RBC: 4.63 x10E6/uL (ref 3.77–5.28)
RDW: 13.4 % (ref 12.3–15.4)
WBC: 5.6 10*3/uL (ref 3.4–10.8)

## 2016-12-05 MED ORDER — ROSUVASTATIN CALCIUM 5 MG PO TABS: 5.0000 mg | ORAL_TABLET | Freq: Every day | ORAL | 3 refills | Status: DC

## 2016-12-05 MED ORDER — AMLODIPINE BESYLATE 2.5 MG PO TABS: 2.5000 mg | ORAL_TABLET | Freq: Every day | ORAL | 3 refills | Status: DC

## 2016-12-05 NOTE — Telephone Encounter (Signed)
-----   Message from Charlie Pitter, [place] sent at 12/05/2016 10:21 AM EDT ----- Please call patient. Lipids are uncontrolled again. In the past she was on Crestor 5mg  daily with marked improvement in her cholesterol. Please find out why she stopped. If no significant problems and agreeable to restarting, would restart with f/u LFTs and lipids in 6 weeks. Also let her know to f/u PCP for abnormal blood sugar. Dayna Dunn PA-C

## 2016-12-05 NOTE — Telephone Encounter (Signed)
New message       Returning a call to the nurse to get lab results

## 2016-12-08 DIAGNOSIS — M170 Bilateral primary osteoarthritis of knee: Secondary | ICD-10-CM | POA: Diagnosis not present

## 2016-12-08 DIAGNOSIS — Z0000 Encounter for general adult medical examination without abnormal findings: Secondary | ICD-10-CM | POA: Diagnosis not present

## 2016-12-11 DIAGNOSIS — E890 Postprocedural hypothyroidism: Secondary | ICD-10-CM | POA: Diagnosis not present

## 2017-01-15 ENCOUNTER — Other Ambulatory Visit: Payer: Medicare Other | Admitting: *Deleted

## 2017-01-15 DIAGNOSIS — Z79899 Other long term (current) drug therapy: Secondary | ICD-10-CM

## 2017-01-15 LAB — HEPATIC FUNCTION PANEL
ALBUMIN: 4.2 g/dL (ref 3.5–4.8)
ALT: 18 IU/L (ref 0–32)
AST: 16 IU/L (ref 0–40)
Alkaline Phosphatase: 59 IU/L (ref 39–117)
Bilirubin Total: 0.5 mg/dL (ref 0.0–1.2)
Bilirubin, Direct: 0.13 mg/dL (ref 0.00–0.40)
Total Protein: 6.3 g/dL (ref 6.0–8.5)

## 2017-01-15 LAB — LIPID PANEL
CHOLESTEROL TOTAL: 190 mg/dL (ref 100–199)
Chol/HDL Ratio: 3.4 ratio (ref 0.0–4.4)
HDL: 56 mg/dL (ref >39)
LDL CALC: 119 mg/dL — AB (ref 0–99)
TRIGLYCERIDES: 74 mg/dL (ref 0–149)
VLDL CHOLESTEROL CAL: 15 mg/dL (ref 5–40)

## 2017-01-16 ENCOUNTER — Telehealth: Payer: Self-pay

## 2017-01-16 MED ORDER — ROSUVASTATIN CALCIUM 5 MG PO TABS: 5.0000 mg | ORAL_TABLET | Freq: Every day | ORAL | 3 refills | Status: DC

## 2017-01-16 NOTE — Telephone Encounter (Signed)
-----   Message from Erma Heritage, [place] sent at 01/15/2017  5:06 PM EDT ----- Please let the patient know her total cholesterol has declined from 213 to 190 and LDL from 138 to 119. Appears she did not tolerate higher doses of Crestor in the past, therefore would continue on low-dose Crestor 43m daily if she is tolerating this well. Liver function tests are within normal limits. Thank you!

## 2017-01-16 NOTE — Telephone Encounter (Signed)
Informed patient of results and verbal understanding expressed.  Instructed patient to continue Crestor 5 mg as she is tolerating it well. Refills called in. She was grateful for call and agrees with treatment plan.

## 2017-01-31 ENCOUNTER — Other Ambulatory Visit: Payer: Self-pay | Admitting: Pulmonary Disease

## 2017-01-31 ENCOUNTER — Other Ambulatory Visit (INDEPENDENT_AMBULATORY_CARE_PROVIDER_SITE_OTHER): Payer: Medicare Other

## 2017-01-31 ENCOUNTER — Ambulatory Visit (INDEPENDENT_AMBULATORY_CARE_PROVIDER_SITE_OTHER): Payer: Medicare Other | Admitting: Pulmonary Disease

## 2017-01-31 ENCOUNTER — Encounter: Payer: Self-pay | Admitting: Pulmonary Disease

## 2017-01-31 VITALS — BP 126/82 | HR 85 | Ht 63.0 in | Wt 181.2 lb

## 2017-01-31 DIAGNOSIS — IMO0001 Reserved for inherently not codable concepts without codable children: Secondary | ICD-10-CM

## 2017-01-31 DIAGNOSIS — J309 Allergic rhinitis, unspecified: Secondary | ICD-10-CM

## 2017-01-31 DIAGNOSIS — R0609 Other forms of dyspnea: Secondary | ICD-10-CM

## 2017-01-31 DIAGNOSIS — R911 Solitary pulmonary nodule: Secondary | ICD-10-CM

## 2017-01-31 DIAGNOSIS — M3500 Sicca syndrome, unspecified: Secondary | ICD-10-CM | POA: Diagnosis not present

## 2017-01-31 DIAGNOSIS — M1990 Unspecified osteoarthritis, unspecified site: Secondary | ICD-10-CM | POA: Diagnosis not present

## 2017-01-31 DIAGNOSIS — K5190 Ulcerative colitis, unspecified, without complications: Secondary | ICD-10-CM | POA: Insufficient documentation

## 2017-01-31 LAB — C-REACTIVE PROTEIN: CRP: 0.3 mg/dL — ABNORMAL LOW (ref 0.5–20.0)

## 2017-01-31 LAB — SEDIMENTATION RATE: SED RATE: 19 mm/h (ref 0–30)

## 2017-01-31 NOTE — Progress Notes (Signed)
Subjective:    Patient ID: [name], female    DOB: April 10, 1936, 81 y.o.   MRN: [id number]  HPI She reports environmental allergies and hay fever as a child. No childhood history of asthma. She reports remote bronchitis but this was remote. No history of pneumonia. She reports she has had dyspnea for a "couple of years" and seems to be steadily worsening. She reports increasing frequency of nonproductive cough. Denies any audible wheezing. She reports her voice quality is becoming more coarse and progressively worsening. She denies any dyspnea at rest. She reports dyspnea with carrying groceries in from her car and going out to her mailbox. She denies any chest pain but does have tightness & pressure. She reports there can be some symptom trigger with perfumes but otherwise no other triggers. She does have significant sinus congestion & drainage. No fever or chills. She reports pain in both knees and left shoulder after a prior injury. She reports stiffness and pain in her hands bilaterally that is worse in her right hand. No joint swelling or erythema. She does have some dry eyes and dry mouth as well as occasional oral ulcers.   Review of Systems No rashes or abnormal bruising. She denies any odynophagia but does have some cervical dysphagia. She reports she has had reflux intermittently and previously was on Nexium. Only occasional morning brash water taste. A pertinent 14 point review of systems is negative except as per the history of presenting illness.  Allergies  Allergen Reactions  . Aspirin Swelling    CAN TOLERATE 81MG    Current Outpatient Prescriptions on File Prior to Visit  Medication Sig Dispense Refill  . acetaminophen (TYLENOL) 500 MG tablet Take 1,000 mg by mouth every 6 (six) hours as needed for moderate pain.    Marland Kitchen amLODipine (NORVASC) 2.5 MG tablet Take 1 tablet (2.5 mg total) by mouth daily. 90 tablet 3  . aspirin 81 MG tablet Take 81 mg by mouth daily.      Marland Kitchen  azelastine (ASTELIN) 137 MCG/SPRAY nasal spray Place 1 spray into the nose as directed. Use in each nostril as directed     . carvedilol (COREG) 25 MG tablet Take 25 mg by mouth 2 (two) times daily with a meal.    . DULoxetine (CYMBALTA) 30 MG capsule Take 30 mg by mouth daily.    . fish oil-omega-3 fatty acids 1000 MG capsule Take 2 g by mouth daily.      Marland Kitchen levothyroxine (SYNTHROID, LEVOTHROID) 112 MCG tablet Take 112 mcg by mouth daily before breakfast.    . mesalamine (LIALDA) 1.2 g EC tablet Take 1.2 g by mouth daily.    . Multiple Vitamin (MULTIVITAMIN) capsule Take 1 capsule by mouth daily.      . ramipril (ALTACE) 10 MG capsule Take 10 mg by mouth 2 (two) times daily.    . rosuvastatin (CRESTOR) 5 MG tablet Take 1 tablet (5 mg total) by mouth daily. 90 tablet 3  . temazepam (RESTORIL) 15 MG capsule Take 15 mg by mouth at bedtime as needed for sleep.     . vitamin C (ASCORBIC ACID) 500 MG tablet Take 500 mg by mouth daily.      . VOLTAREN 1 % GEL Apply 1 application topically at bedtime as needed (for knee pain).      No current facility-administered medications on file prior to visit.     Past Medical History:  Diagnosis Date  . Ankle fracture   .  Colitis   . DCM (dilated cardiomyopathy) ([place])    a. previously idiopathic with EF 20% in 2006 s/p ICD with subsequent normalization of EF and removal of defibrillator  . Depression   . Diastolic dysfunction   . Diverticula, colon   . Dyslipidemia   . Fecal incontinence    mild  . GERD (gastroesophageal reflux disease)   . Hypertension   . Mild aortic insufficiency   . Normal coronary arteries 2006  . Osteoarthritis    both knees  . Overactive bladder   . Post-surgical hypothyroidism   . Pulmonary nodule   . Seasonal allergies   . Thyroid nodule    papillary cancer    Past Surgical History:  Procedure Laterality Date  . ABDOMINAL HYSTERECTOMY    . BROKEN ANKLE    . CARDIAC DEFIBRILLATOR PLACEMENT    . GENERATOR REMOVAL  N/A 07/20/2014   Procedure: ICD GENERATOR REMOVAL;  Surgeon: Evans Lance, MD;  Location: [facility] CATH LAB;  Service: Cardiovascular;  Laterality: N/A;  . KNEE SURGERY  01/2002   both  . left ankle surgery  08/1999  . NASAL SINUS SURGERY    . NOSE SURGERY  1987  . THYROIDECTOMY  03/29/2009   papillary cancer  . TONSILECTOMY, ADENOIDECTOMY, BILATERAL MYRINGOTOMY AND TUBES  1956  . TONSILLECTOMY    . TORE KNEE    . TOTAL ABDOMINAL HYSTERECTOMY W/ BILATERAL SALPINGOOPHORECTOMY  1988    Family History  Problem Relation Age of Onset  . Emphysema Father   . Hypertension Mother   . Alzheimer's disease Mother   . Allergy (severe) Mother   . Lung cancer Brother   . Heart disease Brother   . Heart disease Sister   . COPD Daughter   . Thyroid disease Daughter   . Thyroid disease Daughter     Social History   Social History  . Marital status: Divorced    Spouse name: N/A  . Number of children: N/A  . Years of education: N/A   Social History Main Topics  . Smoking status: Passive Smoke Exposure - Never Smoker  . Smokeless tobacco: Never Used     Comment: Parents, siblings, and ex-husband smoked around her.  . Alcohol use Yes     Comment: rare  . Drug use: No  . Sexual activity: Not Asked   Other Topics Concern  . None   Social History Narrative   [facility] (01/31/17):   Originally from [place]. She lived in [place] most of her life. Also lived in [place] and outside [place], [place]. She lived in [place] for 1 year as a child. Moved to [place] in [place]. 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 Previously enjoyed gardening and sewing. Has 2 dogs & 2 cats currently. Remote parakeets as a child. No mold or hot tub exposure.       Objective:   Physical Exam BP 126/82 (BP Location: Left Arm, Cuff Size: Normal)   Pulse 85   Ht _0  (1.6 m)   Wt 181 lb 3.2 oz (82.2 kg)   SpO2 97%   BMI 32.10 kg/m  General:  Awake. Alert. No acute distress.  Caucasian female.  Integument:  Warm & dry. No rash on exposed skin. No bruising on exposed skin. Extremities:  No cyanosis or clubbing.  Lymphatics:  No appreciated cervical or supraclavicular lymphadenoapthy. HEENT:  Tackymucus membranes. No oral ulcers. No scleral injection or icterus.  Minimal nasal turbinate  swelling. Cardiovascular:  Regular rate. No edema. No appreciable JVD.  Pulmonary:  Good aeration & clear to auscultation bilaterally. Symmetric chest wall expansion. No accessory muscle use on room air . Abdomen: Soft. Normal bowel sounds. Nondistended.  Musculoskeletal:  Normal bulk and tone. Hand grip strength 4-/5 bilaterally.  Mild deformity of the DIP joints bilaterally. Synovial thickening of bilateral MCP & PIP joints without obvious deformity. Neurological:  CN 2-12 grossly in tact. No meningismus. Moving all 4 extremities equally. Symmetric brachioradialis deep tendon reflexes. Psychiatric:  Mood and affect congruent. Speech normal rhythm, rate & tone.   IMAGING CXR PA/LAT 08/31/16 (personally reviewed by me):  No pleural effusion appreciated but patient doesn't silhouetting of left hemidiaphragm. Implanted device noted. Cardiomegaly noted. Mediastinum normal in contour.  CT CHEST W/O 9/10/9 (personally reviewed by me):  No pleural effusion or thickening. No pathologic mediastinal adenopathy. No pericardial effusion. 8 mm rounded right lower lobe nodule. High-resolution cuts show borderline bronchiectasis. No groundglass opacities, intralobular septal thickening, or honeycombing changes to suggest ILD.  CARDIAC TTE (11/22/16):  LV normal in size with moderate focal basal septal hypertrophy. EF 60-65%. No regional wall motion abnormalities. Grade 1 diastolic dysfunction. LA & RA normal in size. Pacer wire or catheter noted within right atrium. RV normal in size and function with pacer wire or catheter noted within RV. Mild aortic regurgitation without stenosis. Aortic root normal in  size. Trivial mitral regurgitation. Trivial pulmonic regurgitation. Mild tricuspid regurgitation. No pericardial effusion.    Assessment & Plan:  81 y.o.  female with long-standing history of environmental allergies. She does have a slight progression in her dyspnea on exertion as well as a reported nonproductive cough. Certainly with her history of secondhand smoke exposure a COPD/asthma overlap syndrome must be considered. Reviewing her previous chest CT imaging as well as her recent chest x-ray does not indicate a parenchymal process. However, with worsening in her arthritis, specifically of her hands, I do question whether or not she could have an evolving autoimmune disease. With her SICCA  Sjogren's must be considered as well as rheumatoid. We discussed repeating chest imaging with a CT scan but I will defer this pending her serum workup and pulmonary function testing. I instructed the patient contact me if she had any new breathing problems or questions before next appointment.   1. Dyspnea on exertion:  Checking full pulmonary function testing and 6 minute walk test on room air before next appointment. 2. Arthritis: Checking serum ANA, ESR, CRP, anti-CCP, and rheumatoid factor. 3. SICCA: Checking serum ANA & Sjogren's panel. 4. Chronic allergic rhinitis: Checking RAST panel. No new medications. 5. Right lower lobe nodule: Holding off on chest imaging pending testing above. 6. Follow-up: Return to clinic in 6 weeks or sooner if needed.  Sonia Baller Ashok Cordia, M.D. [facility] Pager:  [phone] After 3pm or if no response, call [phone] 2:40 PM 01/31/17

## 2017-01-31 NOTE — Patient Instructions (Addendum)
   Call or e-mail me if you have any new breathing problems or questions before your next appointment.  I will review your test results at your follow-up appointment.  TESTS ORDERED: 1. Full PFTs on or before next appointment 2. 6MWT on room air on or before next appointment 3. Serum RAST Panel, ANA, ESR, CRP, Sjogrens Panel, Anti-CCP, & Rheumatoid Factor.

## 2017-02-01 LAB — RESPIRATORY ALLERGY PROFILE REGION II ~~LOC~~
Allergen, Cedar tree, t12: <0.1 kU/L
Allergen, Comm Silver Birch, t9: <0.1 kU/L
Allergen, Cottonwood, t14: <0.1 kU/L
Allergen, Mouse Urine Protein, e78: <0.1 kU/L
Allergen, P. notatum, m1: <0.1 kU/L
Aspergillus fumigatus, m3: <0.1 kU/L
Bermuda Grass: <0.1 kU/L
Common Ragweed: <0.1 kU/L
Elm IgE: <0.1 kU/L
IgE (Immunoglobulin E), Serum: 6 kU/L (ref <115)
Rough Pigweed  IgE: <0.1 kU/L
Sheep Sorrel IgE: <0.1 kU/L

## 2017-02-01 LAB — SJOGRENS SYNDROME-A EXTRACTABLE NUCLEAR ANTIBODY: SSA (RO) (ENA) ANTIBODY, IGG: NEGATIVE

## 2017-02-01 LAB — ANA: ANA: NEGATIVE

## 2017-02-01 LAB — SJOGRENS SYNDROME-B EXTRACTABLE NUCLEAR ANTIBODY: SSB (LA) (ENA) ANTIBODY, IGG: NEGATIVE

## 2017-02-01 LAB — CYCLIC CITRUL PEPTIDE ANTIBODY, IGG: Cyclic Citrullin Peptide Ab: <16 Units

## 2017-02-01 LAB — RHEUMATOID FACTOR

## 2017-02-02 ENCOUNTER — Ambulatory Visit (INDEPENDENT_AMBULATORY_CARE_PROVIDER_SITE_OTHER): Payer: Medicare Other | Admitting: *Deleted

## 2017-02-02 DIAGNOSIS — R0602 Shortness of breath: Secondary | ICD-10-CM | POA: Diagnosis not present

## 2017-02-02 NOTE — Addendum Note (Signed)
Addended by: Valerie Salts on: 02/02/2017 03:29 PM   Modules accepted: Level of Service

## 2017-02-02 NOTE — Progress Notes (Signed)
SIX MIN WALK 02/02/2017  Medications aspirin 81mg , carvedilol 25mg , levothyroxine 191mcg, multivitamin, omega 3 and ramip  Supplimental Oxygen during Test? (L/min) No  Laps 5  Partial Lap (in Meters) 45  Baseline BP (sitting) 118/74  Baseline Heartrate 72  Baseline Dyspnea (Borg Scale) 0.5  Baseline Fatigue (Borg Scale) 3  Baseline SPO2 100  BP (sitting) 128/74  Heartrate 94  Dyspnea (Borg Scale) 3  Fatigue (Borg Scale) 3  SPO2 99  BP (sitting) 124/72  Heartrate 72  SPO2 100  Stopped or Paused before Six Minutes No  Interpretation Hip pain  Distance Completed 285  Tech Comments: Patient walked a steady pace. Explained that prior to test that she had not slept well in a few nights. Denied any shortness of breath, chest pain.

## 2017-02-07 ENCOUNTER — Ambulatory Visit: Payer: Medicare Other

## 2017-03-21 ENCOUNTER — Ambulatory Visit: Payer: Medicare Other

## 2017-03-21 ENCOUNTER — Encounter: Payer: Medicare Other | Admitting: Acute Care

## 2017-03-29 ENCOUNTER — Ambulatory Visit (INDEPENDENT_AMBULATORY_CARE_PROVIDER_SITE_OTHER): Payer: Medicare Other | Admitting: Pulmonary Disease

## 2017-03-29 ENCOUNTER — Ambulatory Visit: Payer: Medicare Other | Admitting: Pulmonary Disease

## 2017-03-29 DIAGNOSIS — R0609 Other forms of dyspnea: Secondary | ICD-10-CM

## 2017-03-29 LAB — PULMONARY FUNCTION TEST
DL/VA % pred: 86 %
DL/VA: 3.96 ml/min/mmHg/L
DLCO COR: 15.69 ml/min/mmHg
DLCO cor % pred: 70 %
DLCO unc % pred: 72 %
DLCO unc: 16.11 ml/min/mmHg
FEF 25-75 PRE: 1.41 L/s
FEF 25-75 Post: 2.53 L/sec
FEF2575-%Change-Post: 79 %
FEF2575-%PRED-PRE: 102 %
FEF2575-%Pred-Post: 184 %
FEV1-%Change-Post: 13 %
FEV1-%PRED-POST: 92 %
FEV1-%PRED-PRE: 81 %
FEV1-POST: 1.69 L
FEV1-PRE: 1.49 L
FEV1FVC-%Change-Post: 0 %
FEV1FVC-%Pred-Pre: 107 %
FEV6-%CHANGE-POST: 14 %
FEV6-%PRED-PRE: 78 %
FEV6-%Pred-Post: 89 %
FEV6-POST: 2.09 L
FEV6-PRE: 1.82 L
FEV6FVC-%Change-Post: 0 %
FEV6FVC-%PRED-POST: 103 %
FEV6FVC-%PRED-PRE: 103 %
FVC-%CHANGE-POST: 14 %
FVC-%PRED-PRE: 75 %
FVC-%Pred-Post: 86 %
FVC-POST: 2.14 L
FVC-Pre: 1.87 L
POST FEV6/FVC RATIO: 98 %
PRE FEV1/FVC RATIO: 80 %
Post FEV1/FVC ratio: 79 %
Pre FEV6/FVC Ratio: 97 %
RV % PRED: 165 %
RV: 3.81 L
TLC % PRED: 120 %
TLC: 5.81 L

## 2017-03-29 NOTE — Progress Notes (Signed)
PFT done today. 

## 2017-04-03 ENCOUNTER — Ambulatory Visit (INDEPENDENT_AMBULATORY_CARE_PROVIDER_SITE_OTHER): Payer: Medicare Other | Admitting: Pulmonary Disease

## 2017-04-03 VITALS — BP 130/70 | HR 72 | Ht 61.0 in | Wt 180.0 lb

## 2017-04-03 DIAGNOSIS — J4540 Moderate persistent asthma, uncomplicated: Secondary | ICD-10-CM | POA: Diagnosis not present

## 2017-04-03 DIAGNOSIS — Z23 Encounter for immunization: Secondary | ICD-10-CM | POA: Diagnosis not present

## 2017-04-03 DIAGNOSIS — J309 Allergic rhinitis, unspecified: Secondary | ICD-10-CM

## 2017-04-03 MED ORDER — SPACER/AERO CHAMBER MOUTHPIECE MISC: 1.0000 | 0 refills | Status: DC

## 2017-04-03 MED ORDER — MONTELUKAST SODIUM 10 MG PO TABS: 10.0000 mg | ORAL_TABLET | Freq: Every day | ORAL | 3 refills | Status: DC

## 2017-04-03 MED ORDER — BUDESONIDE-FORMOTEROL FUMARATE 80-4.5 MCG/ACT IN AERO: 2.0000 | INHALATION_SPRAY | Freq: Every day | RESPIRATORY_TRACT | 12 refills | Status: DC

## 2017-04-03 NOTE — Patient Instructions (Signed)
   Use the Symbicort inhaler with the spacer we are giving you today. Inhale 2 puffs twice daily.  Remember to remove any dentures or partials you have before you use your inhaler. Remember to brush your teeth & tongue after you use your inhaler as well as rinse, gargle & spit to keep from getting thrush in your mouth or on your tongue (a white film).   Call me in a week or so to get a prescription for the Symbicort or to let me know that our changes aren't working because then we'll continue with your workup.  TESTS ORDERED: 1. Spirometry with bronchodilator challenge at next appointment.

## 2017-04-03 NOTE — Addendum Note (Signed)
Addended by: Della Goo C on: 04/03/2017 03:58 PM   Modules accepted: Orders

## 2017-04-03 NOTE — Progress Notes (Addendum)
Subjective:    Patient ID: [name], female    DOB: January 17, 1936, 81 y.o.   MRN: [id number]  C.C.:  Follow-up for Dyspnea on Exertion, Chronic Allergic Rhinitis, & Right Lower Lobe Nodule.   HPI Dyspnea on Exertion: 6 minute walk test shows no significant desaturation or hypoxia. Spirometry shows no fixed airway obstruction but does show a significant bronchodilator response as well as air trapping on lung volumes. She still has dyspnea with minimal exertion. Chronic coughing & wheezing. Cough is nonproductive. She does have trouble breathing with exposure to cologne, perfumes, and strong odors.   Chronic Allergic Rhinitis: Serum testing shows normal IgE and no evidence of any reaction with RAST panel. She does have ongoing sinus congestion & drainage. Has a history of remote sinus surgery.   Right Lower Lobe Nodule: Measures 8 mm on CT imaging from 2009.  Review of Systems She does have intermittent chest pain, pressure & tightness. She isn't sure that this is temporally associated with her dyspnea. She reports the feeling like a "belt tightening" along her lower ribs at times. She reports some mild abdominal discomfort that is chronic and she feels associated with her Ulcerative Colitis. No new pain. No nausea or emesis. No rashes or bruising.   Allergies  Allergen Reactions  . Aspirin Swelling    CAN TOLERATE 81MG    Current Outpatient Prescriptions on File Prior to Visit  Medication Sig Dispense Refill  . acetaminophen (TYLENOL) 500 MG tablet Take 1,000 mg by mouth every 6 (six) hours as needed for moderate pain.    [place] Kitchen amLODipine (NORVASC) 2.5 MG tablet Take 1 tablet (2.5 mg total) by mouth daily. 90 tablet 3  . aspirin 81 MG tablet Take 81 mg by mouth daily.      Marland Kitchen azelastine (ASTELIN) 137 MCG/SPRAY nasal spray Place 1 spray into the nose as directed. Use in each nostril as directed     . carvedilol (COREG) 25 MG tablet Take 25 mg by mouth 2 (two) times daily with a meal.      . DULoxetine (CYMBALTA) 30 MG capsule Take 30 mg by mouth daily.    . fish oil-omega-3 fatty acids 1000 MG capsule Take 2 g by mouth daily.      Marland Kitchen levothyroxine (SYNTHROID, LEVOTHROID) 112 MCG tablet Take 112 mcg by mouth daily before breakfast.    . mesalamine (LIALDA) 1.2 g EC tablet Take 1.2 g by mouth daily.    . Multiple Vitamin (MULTIVITAMIN) capsule Take 1 capsule by mouth daily.      . ramipril (ALTACE) 10 MG capsule Take 10 mg by mouth 2 (two) times daily.    . rosuvastatin (CRESTOR) 5 MG tablet Take 1 tablet (5 mg total) by mouth daily. 90 tablet 3  . temazepam (RESTORIL) 15 MG capsule Take 15 mg by mouth at bedtime as needed for sleep.     . vitamin C (ASCORBIC ACID) 500 MG tablet Take 500 mg by mouth daily.      . VOLTAREN 1 % GEL Apply 1 application topically at bedtime as needed (for knee pain).      No current facility-administered medications on file prior to visit.     Past Medical History:  Diagnosis Date  . Ankle fracture   . Colitis   . DCM (dilated cardiomyopathy) ([facility])    a. previously idiopathic with EF 20% in 2006 s/p ICD with subsequent normalization of EF and removal of defibrillator  . Depression   . Diastolic  dysfunction   . Diverticula, colon   . Dyslipidemia   . Fecal incontinence    mild  . GERD (gastroesophageal reflux disease)   . Hypertension   . Mild aortic insufficiency   . Normal coronary arteries 2006  . Osteoarthritis    both knees  . Overactive bladder   . Post-surgical hypothyroidism   . Pulmonary nodule   . Seasonal allergies   . Thyroid nodule    papillary cancer    Past Surgical History:  Procedure Laterality Date  . ABDOMINAL HYSTERECTOMY    . BROKEN ANKLE    . CARDIAC DEFIBRILLATOR PLACEMENT    . GENERATOR REMOVAL N/A 07/20/2014   Procedure: ICD GENERATOR REMOVAL;  Surgeon: Evans Lance, MD;  Location: [facility] CATH LAB;  Service: Cardiovascular;  Laterality: N/A;  . KNEE SURGERY  01/2002   both  . left ankle surgery  08/1999   . NASAL SINUS SURGERY    . NOSE SURGERY  1987  . THYROIDECTOMY  03/29/2009   papillary cancer  . TONSILECTOMY, ADENOIDECTOMY, BILATERAL MYRINGOTOMY AND TUBES  1956  . TONSILLECTOMY    . TORE KNEE    . TOTAL ABDOMINAL HYSTERECTOMY W/ BILATERAL SALPINGOOPHORECTOMY  1988    Family History  Problem Relation Age of Onset  . Emphysema Father   . Hypertension Mother   . Alzheimer's disease Mother   . Allergy (severe) Mother   . Lung cancer Brother   . Heart disease Brother   . Heart disease Sister   . COPD Daughter   . Thyroid disease Daughter   . Thyroid disease Daughter     Social History   Social History  . Marital status: Divorced    Spouse name: N/A  . Number of children: N/A  . Years of education: N/A   Social History Main Topics  . Smoking status: Passive Smoke Exposure - Never Smoker  . Smokeless tobacco: Never Used     Comment: Parents, siblings, and ex-husband smoked around her.  . Alcohol use Yes     Comment: rare  . Drug use: No  . Sexual activity: Not on file   Other Topics Concern  . Not on file   Social History Narrative   [facility] (01/31/17):   Originally from [place]. She lived in [place] most of her life. Also lived in [place] and outside [place], [place]. She lived in [place] for 1 year as a child. Moved to [place] in 2001. 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 Previously enjoyed gardening and sewing. Has 2 dogs & 2 cats currently. Remote parakeets as a child. No mold or hot tub exposure.       Objective:   Physical Exam BP 130/70 (BP Location: Right Arm, Cuff Size: Normal)   Pulse 72   Ht 5' 1"  (1.549 m)   Wt 180 lb (81.6 kg)   SpO2 95%   BMI 34.01 kg/m   General:  Elderly female. No distress. Comfortable. Integument:  Warm. Dry. No rash. Extremities:  No cyanosis or clubbing.  HEENT:  Mild bilateral nasal turbinate swelling. No oral ulcers. Moist mucous membranes. Cardiovascular:  Regular rate. No edema.  Regular rhythm. Pulmonary:  Clear bilaterally with auscultation. Normal work of breathing on room air. Abdomen: Soft. Normal bowel sounds. Nondistended.  Musculoskeletal:  Normal bulk and tone. No joint deformity or effusion appreciated. Neurological:  Cranial nerves 2-12 grossly in tact. No meningismus. Moving all 4 extremities equally.   PFT  03/29/17: FVC 1.87 L (75%) FEV1 1.49 L (81%) FEV1/FVC 0.80 FEF 25-75 1.41 L (102%) positive bronchodilator response TLC 5.81 L (120%) RV 165% ERV 29% DLCO corrected 70%   6MWT 02/02/17:  Walked 285 meters / Baseline Sat 100% on RA / Nadir Sat 99% on RA  IMAGING CXR PA/LAT 08/31/16 (previously reviewed by me):  No pleural effusion appreciated but patient doesn't silhouetting of left hemidiaphragm. Implanted device noted. Cardiomegaly noted. Mediastinum normal in contour.  CT CHEST W/O 9/10/9 (previously reviewed by me):  No pleural effusion or thickening. No pathologic mediastinal adenopathy. No pericardial effusion. 8 mm rounded right lower lobe nodule. High-resolution cuts show borderline bronchiectasis. No groundglass opacities, intralobular septal thickening, or honeycombing changes to suggest ILD.  CARDIAC TTE (11/22/16):  LV normal in size with moderate focal basal septal hypertrophy. EF 60-65%. No regional wall motion abnormalities. Grade 1 diastolic dysfunction. LA & RA normal in size. Pacer wire or catheter noted within right atrium. RV normal in size and function with pacer wire or catheter noted within RV. Mild aortic regurgitation without stenosis. Aortic root normal in size. Trivial mitral regurgitation. Trivial pulmonic regurgitation. Mild tricuspid regurgitation. No pericardial effusion.  LABS 01/31/17 CRP: 0.3 ESR: 19 ANA: Negative Anti-CCP:  <16 RA:  <14 SSA:  <1.0 SSB:  <1.0 IgE:  6 RAST panel: Negative    Assessment & Plan:  81 y.o. female with long-standing history of environmental allergies and progressively worsening dyspnea  on exertion, wheezing, and nonproductive cough. Patient's walk test does not demonstrate a significant desaturation or hypoxia. However, her pulmonary function testing does show a significant bronchodilator response and air trapping on lung volumes consistent with airway dysfunction. As such, I am highly suspicious that she has underlying asthma. Despite this, her IgE was normal and her RAST panel did not point to a specific allergen. I am starting the patient on inhaled corticosteroid therapy and long-acting bronchodilator therapy as well as oral medication. I instructed the patient to contact my office if she needed a prescription for Symbicort or to let me know that are medication regimen today was ineffective.  1. Moderate, persistent asthma:  Starting patient on Singulair 10 mg by mouth daily at bedtime & Symbicort 80/4.5 with spacer twice daily. Repeat spirometry with bronchodilator challenge to ensure maximal bronchodilatation. 2. Chronic allergic rhinitis: Continuing on Astelin nasal spray. Starting Singulair 10 mg by mouth daily at bedtime. 3. Health maintenance:  Status post Pneumovax October 2009. Administering high-dose influenza vaccine today. 4. Follow-up:  Return to clinic in 2 months or sooner if needed.  Sonia Baller Ashok Cordia, M.D. [facility] Pager:  [phone] After 3pm or if no response, call [phone] 3:37 PM 04/03/17

## 2017-04-14 ENCOUNTER — Other Ambulatory Visit: Payer: Self-pay | Admitting: Cardiology

## 2017-05-10 DIAGNOSIS — R1031 Right lower quadrant pain: Secondary | ICD-10-CM | POA: Diagnosis not present

## 2017-05-10 DIAGNOSIS — K5150 Left sided colitis without complications: Secondary | ICD-10-CM | POA: Diagnosis not present

## 2017-05-10 DIAGNOSIS — K921 Melena: Secondary | ICD-10-CM | POA: Diagnosis not present

## 2017-05-14 ENCOUNTER — Ambulatory Visit: Payer: Medicare Other | Admitting: Adult Health

## 2017-05-15 ENCOUNTER — Ambulatory Visit: Payer: Medicare Other | Admitting: Adult Health

## 2017-05-18 ENCOUNTER — Ambulatory Visit: Payer: Medicare Other | Admitting: Cardiology

## 2017-05-31 ENCOUNTER — Other Ambulatory Visit: Payer: Self-pay | Admitting: Adult Health

## 2017-05-31 MED ORDER — MONTELUKAST SODIUM 10 MG PO TABS: 10.0000 mg | ORAL_TABLET | Freq: Every day | ORAL | 1 refills | Status: DC

## 2017-06-08 DIAGNOSIS — E890 Postprocedural hypothyroidism: Secondary | ICD-10-CM | POA: Diagnosis not present

## 2017-06-08 DIAGNOSIS — E785 Hyperlipidemia, unspecified: Secondary | ICD-10-CM | POA: Diagnosis not present

## 2017-06-08 DIAGNOSIS — N3281 Overactive bladder: Secondary | ICD-10-CM | POA: Diagnosis not present

## 2017-06-08 DIAGNOSIS — I119 Hypertensive heart disease without heart failure: Secondary | ICD-10-CM | POA: Diagnosis not present

## 2017-06-08 DIAGNOSIS — M170 Bilateral primary osteoarthritis of knee: Secondary | ICD-10-CM | POA: Diagnosis not present

## 2017-06-08 DIAGNOSIS — I10 Essential (primary) hypertension: Secondary | ICD-10-CM | POA: Diagnosis not present

## 2017-06-08 DIAGNOSIS — R2232 Localized swelling, mass and lump, left upper limb: Secondary | ICD-10-CM | POA: Diagnosis not present

## 2017-06-08 DIAGNOSIS — Z85850 Personal history of malignant neoplasm of thyroid: Secondary | ICD-10-CM | POA: Diagnosis not present

## 2017-06-12 ENCOUNTER — Other Ambulatory Visit: Payer: Self-pay | Admitting: Internal Medicine

## 2017-06-12 DIAGNOSIS — R2232 Localized swelling, mass and lump, left upper limb: Secondary | ICD-10-CM

## 2017-06-20 DIAGNOSIS — M19012 Primary osteoarthritis, left shoulder: Secondary | ICD-10-CM | POA: Diagnosis not present

## 2017-06-20 DIAGNOSIS — M19019 Primary osteoarthritis, unspecified shoulder: Secondary | ICD-10-CM | POA: Diagnosis not present

## 2017-06-25 ENCOUNTER — Ambulatory Visit
Admission: RE | Admit: 2017-06-25 | Discharge: 2017-06-25 | Disposition: A | Discharge status: Home / Self Care | Payer: Medicare Other | Source: Ambulatory Visit | Attending: Internal Medicine | Admitting: Internal Medicine

## 2017-06-25 ENCOUNTER — Inpatient Hospital Stay: Admission: RE | Admit: 2017-06-25 | Payer: Medicare Other | Source: Ambulatory Visit

## 2017-06-25 ENCOUNTER — Ambulatory Visit
Admission: RE | Admit: 2017-06-25 | Discharge: 2017-06-25 | Disposition: A | Discharge status: Home / Self Care | Payer: Medicare Other | Source: Ambulatory Visit

## 2017-06-25 DIAGNOSIS — R2232 Localized swelling, mass and lump, left upper limb: Secondary | ICD-10-CM

## 2017-06-25 DIAGNOSIS — R928 Other abnormal and inconclusive findings on diagnostic imaging of breast: Secondary | ICD-10-CM | POA: Diagnosis not present

## 2017-06-25 DIAGNOSIS — N644 Mastodynia: Secondary | ICD-10-CM | POA: Diagnosis not present

## 2017-06-26 ENCOUNTER — Ambulatory Visit (INDEPENDENT_AMBULATORY_CARE_PROVIDER_SITE_OTHER): Payer: Medicare Other | Admitting: Adult Health

## 2017-06-26 ENCOUNTER — Encounter: Payer: Self-pay | Admitting: Adult Health

## 2017-06-26 ENCOUNTER — Ambulatory Visit (INDEPENDENT_AMBULATORY_CARE_PROVIDER_SITE_OTHER)
Admission: RE | Admit: 2017-06-26 | Discharge: 2017-06-26 | Disposition: A | Discharge status: Home / Self Care | Payer: Medicare Other | Source: Ambulatory Visit | Attending: Adult Health | Admitting: Adult Health

## 2017-06-26 VITALS — BP 134/62 | HR 64 | Ht 63.0 in | Wt 178.0 lb

## 2017-06-26 DIAGNOSIS — J4540 Moderate persistent asthma, uncomplicated: Secondary | ICD-10-CM

## 2017-06-26 DIAGNOSIS — J45909 Unspecified asthma, uncomplicated: Secondary | ICD-10-CM | POA: Diagnosis not present

## 2017-06-26 DIAGNOSIS — J309 Allergic rhinitis, unspecified: Secondary | ICD-10-CM

## 2017-06-26 NOTE — Assessment & Plan Note (Signed)
Stable on current regimen with Singulair.  If patient has relapse of wheezing cough may consider changing her ACE inhibitor and/or high-dose beta-blocker to see if this would help her breathing.  Otherwise would continue on Singulair.  Added a pro-air for rescue inhaler. Patient has not had a chest x-ray in 2 years.  We will check one today.   Plan  Patient Instructions  May use ProAir 2 puffs every 4hr as needed for wheezing /shortness of breath - this is your rescue inhaler .  Chest xray today .  Continue on Singulair daily .  May discuss with Cardiology that Ramipril and Coreg may aggravate your breathing /Asthma.  Ffollow up with Dr. Vaughan Browner in 6 months and As needed

## 2017-06-26 NOTE — Assessment & Plan Note (Signed)
Controlled on Singulair

## 2017-06-26 NOTE — Patient Instructions (Signed)
May use ProAir 2 puffs every 4hr as needed for wheezing /shortness of breath - this is your rescue inhaler .  Chest xray today .  Continue on Singulair daily .  May discuss with Cardiology that Ramipril and Coreg may aggravate your breathing /Asthma.  Ffollow up with Dr. Vaughan Browner in 6 months and As needed

## 2017-06-26 NOTE — Progress Notes (Signed)
82 y/o F presents today for follow up on Asthma.  She was last seen on 04/03/17 for chronic cough and wheezing due to  Asthma.  States she has been doing somewhat better overall since last visit.  PMH: HTN, GERD, Diastolic dysfunction, mild aortic insufficiency.  States she never started the Symbicort due to her off and on sores in her mouth and fear of getting thrush.  She is taking her singular as prescribed.  Endorses on going nonproductive chronic cough and SOB with exertion.  States she sleeps well and doesn't wake up at night due to cough. Denies: fever, chills, wheezing, chest pain, hemoptysis, and palpitations.  Also she is not on a rescue inhaler.    Per patient she saw ENT for her throat/voice problems and all the findings were negative.

## 2017-06-26 NOTE — Progress Notes (Signed)
 @Patient ID: [name], female    DOB: 04/04/1936, 82 y.o.   MRN: [id number]  Chief Complaint  Patient presents with  . Follow-up    Asthma     Referring provider: Hepler, Mark, PA-C  HPI: 82-year-old female never smoker followed for Asthma and Allergic rhinitis  TEST  PFT 03/29/17: FVC 1.87 L (75%) FEV1 1.49 L (81%) FEV1/FVC 0.80 FEF 25-75 1.41 L (102%) positive bronchodilator response TLC 5.81 L (120%) RV 165% ERV 29% DLCO corrected 70   6MWT 02/02/17:  Walked 285 meters / Baseline Sat 100% on RA / Nadir Sat 99% on RA  IMAGING CXR PA/LAT 08/31/16  No pleural effusion appreciated but patient doesn't silhouetting of left hemidiaphragm. Implanted device noted. Cardiomegaly noted. Mediastinum normal in contour.  CT CHEST W/O 9/10/9 :  No pleural effusion or thickening. No pathologic mediastinal adenopathy. No pericardial effusion. 8 mm rounded right lower lobe nodule. High-resolution cuts show borderline bronchiectasis. No groundglass opacities, intralobular septal thickening, or honeycombing changes to suggest ILD.  CARDIAC TTE (11/22/16):  LV normal in size with moderate focal basal septal hypertrophy. EF 60-65%. No regional wall motion abnormalities. Grade 1 diastolic dysfunction. LA & RA normal in size. Pacer wire or catheter noted within right atrium. RV normal in size and function with pacer wire or catheter noted within RV. Mild aortic regurgitation without stenosis. Aortic root normal in size. Trivial mitral regurgitation. Trivial pulmonic regurgitation. Mild tricuspid regurgitation. No pericardial effusion.  LABS 01/31/17 CRP: 0.3 ESR: 19 ANA: Negative Anti-CCP:  <16 RA:  <14 SSA:  <1.0 SSB:  <1.0 IgE:  6 RAST panel: Negative  06/26/2017 Follow up : Asthma  Pt returns for 3 month follow up .  Patient has underlying asthma.  She was started on Symbicort last visit however patient did not take.  She was worried this would cause irritation in her mouth.  She  says that her breathing has been doing better.  She is on Singulair daily.  She does not use any albuterol.  She says she has an occasional wheeze and cough.  But feels that her breathing overall is better.  She denies any hemoptysis chest pain orthopnea PND or increased leg swelling.  Of note patient is on an ACE inhibitor and high-dose beta-blocker.  Previous  PFT  did not show airflow obstruction but did show significant Bronchodilator reversibility Last chest x-ray was in 2017.  She denies any hemoptysis weight loss   Allergies  Allergen Reactions  . Aspirin Swelling    CAN TOLERATE 81MG    Immunization History  Administered Date(s) Administered  . Influenza Split 04/02/2016  . Influenza Whole 03/05/2009  . Influenza, High Dose Seasonal PF 04/03/2017  . Pneumococcal Polysaccharide-23 03/05/2008    Past Medical History:  Diagnosis Date  . Ankle fracture   . Colitis   . DCM (dilated cardiomyopathy) ([facility])    a. previously idiopathic with EF 20% in 2006 s/p ICD with subsequent normalization of EF and removal of defibrillator  . Depression   . Diastolic dysfunction   . Diverticula, colon   . Dyslipidemia   . Fecal incontinence    mild  . GERD (gastroesophageal reflux disease)   . Hypertension   . Mild aortic insufficiency   . Normal coronary arteries 2006  . Osteoarthritis    both knees  . Overactive bladder   . Post-surgical hypothyroidism   . Pulmonary nodule   . Seasonal allergies   . Thyroid nodule    papillary cancer      Tobacco History: Social History   Tobacco Use  Smoking Status Passive Smoke Exposure - Never Smoker  Smokeless Tobacco Never Used  Tobacco Comment   Parents, siblings, and ex-husband smoked around her.   Counseling given: Not Answered Comment: Parents, siblings, and ex-husband smoked around her.   Outpatient Encounter Medications as of 06/26/2017  Medication Sig  . acetaminophen (TYLENOL) 500 MG tablet Take 1,000 mg by mouth every 6 (six)  hours as needed for moderate pain.  . amLODipine (NORVASC) 2.5 MG tablet Take 1 tablet (2.5 mg total) by mouth daily.  . aspirin 81 MG tablet Take 81 mg by mouth daily.    . azelastine (ASTELIN) 137 MCG/SPRAY nasal spray Place 1 spray into the nose as directed. Use in each nostril as directed   . carvedilol (COREG) 25 MG tablet TAKE 1 TABLET TWICE A DAY WITH MEALS  . DULoxetine (CYMBALTA) 30 MG capsule Take 30 mg by mouth daily.  . fish oil-omega-3 fatty acids 1000 MG capsule Take 2 g by mouth daily.    . levothyroxine (SYNTHROID, LEVOTHROID) 112 MCG tablet Take 112 mcg by mouth daily before breakfast.  . mesalamine (LIALDA) 1.2 g EC tablet Take 1.2 g by mouth daily.  . montelukast (SINGULAIR) 10 MG tablet Take 1 tablet (10 mg total) by mouth at bedtime.  . Multiple Vitamin (MULTIVITAMIN) capsule Take 1 capsule by mouth daily.    . ramipril (ALTACE) 10 MG capsule TAKE 1 CAPSULE TWICE A DAY  . rosuvastatin (CRESTOR) 5 MG tablet Take 1 tablet (5 mg total) by mouth daily.  . Spacer/Aero Chamber Mouthpiece MISC 1 Device by Does not apply route as directed.  . temazepam (RESTORIL) 15 MG capsule Take 15 mg by mouth at bedtime as needed for sleep.   . vitamin C (ASCORBIC ACID) 500 MG tablet Take 500 mg by mouth daily.    . VOLTAREN 1 % GEL Apply 1 application topically at bedtime as needed (for knee pain).   . [DISCONTINUED] budesonide-formoterol (SYMBICORT) 80-4.5 MCG/ACT inhaler Inhale 2 puffs into the lungs daily. (Patient not taking: Reported on 06/26/2017)   No facility-administered encounter medications on file as of 06/26/2017.      Review of Systems  Constitutional:   No  weight loss, night sweats,  Fevers, chills, fatigue, or  lassitude.  HEENT:   No headaches,  Difficulty swallowing,  Tooth/dental problems, or  Sore throat,                No sneezing, itching, ear ache, nasal congestion, post nasal drip,   CV:  No chest pain,  Orthopnea, PND, swelling in lower extremities, anasarca,  dizziness, palpitations, syncope.   GI  No heartburn, indigestion, abdominal pain, nausea, vomiting, diarrhea, change in bowel habits, loss of appetite, bloody stools.   Resp: No shortness of breath with exertion or at rest.  No excess mucus, no productive cough,  No non-productive cough,  No coughing up of blood.  No change in color of mucus.  No wheezing.  No chest wall deformity  Skin: no rash or lesions.  GU: no dysuria, change in color of urine, no urgency or frequency.  No flank pain, no hematuria   MS:  No joint pain or swelling.  No decreased range of motion.  No back pain.    Physical Exam  BP 134/62 (BP Location: Left Arm, Cuff Size: Normal)   Pulse 64   Ht 5' 3" (1.6 m)   Wt 178 lb (80.7 kg)   SpO2   98%   BMI 31.53 kg/m   GEN: A/Ox3; pleasant , NAD, elderly   HEENT:  [place]/AT,  EACs-clear, TMs-wnl, NOSE-clear, THROAT-clear, no lesions, no postnasal drip or exudate noted.   NECK:  Supple w/ fair ROM; no JVD; normal carotid impulses w/o bruits; no thyromegaly or nodules palpated; no lymphadenopathy.    RESP  Clear  P & A; w/o, wheezes/ rales/ or rhonchi. no accessory muscle use, no dullness to percussion  CARD:  RRR, no m/r/g, no peripheral edema, pulses intact, no cyanosis or clubbing.  GI:   Soft & nt; nml bowel sounds; no organomegaly or masses detected.   Musco: Warm bil, no deformities or joint swelling noted.   Neuro: alert, no focal deficits noted.    Skin: Warm, no lesions or rashes    Lab Results:  CBC  BMET  BNP    Component Value Date/Time   BNP 22.1 09/01/2015 1446    ProBNP No results found for: PROBNP  Imaging: Mm Diag Breast Tomo Bilateral  Result Date: 06/25/2017 CLINICAL DATA:  Possible palpable lump involving the low left axilla associated with pain. Patient has current history of a frozen left shoulder. Annual mammographic evaluation. Patient has not had a mammogram since 2007. EXAM: 2D DIGITAL DIAGNOSTIC BILATERAL MAMMOGRAM WITH  CAD AND ADJUNCT TOMO ULTRASOUND LEFT AXILLA COMPARISON:  Mammography 08/07/2005.  No prior ultrasound. ACR Breast Density Category b: There are scattered areas of fibroglandular density. FINDINGS: Standard 2D and tomosynthesis full field CC and MLO views of both breasts were obtained. No findings suspicious for malignancy in either breast. The battery generator pack for the patient's indwelling pacemaker overlies the left axilla on the MLO view. Mammographic images were processed with CAD. On physical exam, there was palpable thickening in the low left axilla in the area of clinical concern, though I do not palpate a discrete mass or pathologic lymphadenopathy. The patient describes tenderness to palpation. Targeted low left axillary ultrasound is performed, showing normal subcutaneous fat, the normal-appearing serratus anterior muscle and a lateral left rib in the area of focal pain and tenderness. No mass or pathologic lymphadenopathy is identified. IMPRESSION: 1. No mammographic evidence of malignancy involving either breast. 2. No pathologic left axillary lymphadenopathy. 3. The normal appearing left serratus anterior muscle and a lateral left rib are present in the area of focal pain and palpable concern. RECOMMENDATION: Screening mammogram in one year.(Code:SM-B-01Y) I have discussed the findings and recommendations with the patient. Results were also provided in writing at the conclusion of the visit. If applicable, a reminder letter will be sent to the patient regarding the next appointment. BI-RADS CATEGORY  1: Negative. Electronically Signed   By: Evangeline Dakin M.D.   On: 06/25/2017 14:57   [place] Axilla Left  Result Date: 06/25/2017 CLINICAL DATA:  Possible palpable lump involving the low left axilla associated with pain. Patient has current history of a frozen left shoulder. Annual mammographic evaluation. Patient has not had a mammogram since 2007. EXAM: 2D DIGITAL DIAGNOSTIC BILATERAL MAMMOGRAM WITH  CAD AND ADJUNCT TOMO ULTRASOUND LEFT AXILLA COMPARISON:  Mammography 08/07/2005.  No prior ultrasound. ACR Breast Density Category b: There are scattered areas of fibroglandular density. FINDINGS: Standard 2D and tomosynthesis full field CC and MLO views of both breasts were obtained. No findings suspicious for malignancy in either breast. The battery generator pack for the patient's indwelling pacemaker overlies the left axilla on the MLO view. Mammographic images were processed with CAD. On physical exam, there was palpable thickening in  the low left axilla in the area of clinical concern, though I do not palpate a discrete mass or pathologic lymphadenopathy. The patient describes tenderness to palpation. Targeted low left axillary ultrasound is performed, showing normal subcutaneous fat, the normal-appearing serratus anterior muscle and a lateral left rib in the area of focal pain and tenderness. No mass or pathologic lymphadenopathy is identified. IMPRESSION: 1. No mammographic evidence of malignancy involving either breast. 2. No pathologic left axillary lymphadenopathy. 3. The normal appearing left serratus anterior muscle and a lateral left rib are present in the area of focal pain and palpable concern. RECOMMENDATION: Screening mammogram in one year.(Code:SM-B-01Y) I have discussed the findings and recommendations with the patient. Results were also provided in writing at the conclusion of the visit. If applicable, a reminder letter will be sent to the patient regarding the next appointment. BI-RADS CATEGORY  1: Negative. Electronically Signed   By: Thomas  Lawrence M.D.   On: 06/25/2017 14:57     Assessment & Plan:   Moderate persistent asthma Stable on current regimen with Singulair.  If patient has relapse of wheezing cough may consider changing her ACE inhibitor and/or high-dose beta-blocker to see if this would help her breathing.  Otherwise would continue on Singulair.  Added a pro-air for  rescue inhaler. Patient has not had a chest x-ray in 2 years.  We will check one today.   Plan  Patient Instructions  May use ProAir 2 puffs every 4hr as needed for wheezing /shortness of breath - this is your rescue inhaler .  Chest xray today .  Continue on Singulair daily .  May discuss with Cardiology that Ramipril and Coreg may aggravate your breathing /Asthma.  Ffollow up with Dr. Mannam in 6 months and As needed       Allergic rhinitis Controlled on Singulair      , NP 06/26/2017  

## 2017-07-02 DIAGNOSIS — K5150 Left sided colitis without complications: Secondary | ICD-10-CM | POA: Diagnosis not present

## 2017-07-02 DIAGNOSIS — R1031 Right lower quadrant pain: Secondary | ICD-10-CM | POA: Diagnosis not present

## 2017-07-26 ENCOUNTER — Ambulatory Visit (INDEPENDENT_AMBULATORY_CARE_PROVIDER_SITE_OTHER): Payer: Medicare Other | Admitting: Cardiology

## 2017-07-26 ENCOUNTER — Encounter: Payer: Self-pay | Admitting: Cardiology

## 2017-07-26 VITALS — BP 130/70 | HR 87 | Ht 63.0 in | Wt 177.6 lb

## 2017-07-26 DIAGNOSIS — R0602 Shortness of breath: Secondary | ICD-10-CM

## 2017-07-26 DIAGNOSIS — I420 Dilated cardiomyopathy: Secondary | ICD-10-CM

## 2017-07-26 DIAGNOSIS — I10 Essential (primary) hypertension: Secondary | ICD-10-CM | POA: Diagnosis not present

## 2017-07-26 NOTE — Progress Notes (Signed)
Cardiology Office Note:    Date:  07/26/2017   ID:  [name], DOB 06/30/1935, MRN [id number]  PCP:  Heather Skeans, PA-C  Cardiologist:  No primary care provider on file.    Referring MD: Heather Shelter, PA-C   Chief Complaint  Patient presents with  . Atrial Fibrillation  . Cardiomyopathy  . Hypertension    History of Present Illness:    [name] is a 82 y.o. female with a hx of idiopathic DCM now with EF 70%, normal coronary arteries by cath 2006, HTN and chronic SOB and chest pain due to GERD.  She was referred to Pulmonary and a 6 minutes walk test showed no significant desaturation and spirometry was normal.  She was felt to have moderate persistent asthma and was started on Singulair and Symbicort and also felt to have chronic allergic rhinitis and was started on Astelin nasal spray.    She is here today for followup and is doing well.  She still has some DOE but it has improved on steroid inhalers and nasal steroids as well as singulair.  She still has chronic sharp CP under her left axilla that she thinks is related to retained pacer wire.  She denies any  PND, orthopnea, LE edema, dizziness (except when getting up too fast), palpitations or syncope. She still has chronic fatigue.  She is compliant with her meds and is tolerating meds with no SE.    Past Medical History:  Diagnosis Date  . Ankle fracture   . Colitis   . DCM (dilated cardiomyopathy) ([place])    a. previously idiopathic with EF 20% in 2006 s/p ICD with subsequent normalization of EF and removal of defibrillator  . Depression   . Diastolic dysfunction   . Diverticula, colon   . Dyslipidemia   . Fecal incontinence    mild  . GERD (gastroesophageal reflux disease)   . Hypertension   . Mild aortic insufficiency   . Normal coronary arteries 2006  . Osteoarthritis    both knees  . Overactive bladder   . Post-surgical hypothyroidism   . Pulmonary nodule   . Seasonal allergies   . Thyroid  nodule    papillary cancer    Past Surgical History:  Procedure Laterality Date  . ABDOMINAL HYSTERECTOMY    . BROKEN ANKLE    . CARDIAC DEFIBRILLATOR PLACEMENT    . GENERATOR REMOVAL N/A 07/20/2014   Procedure: ICD GENERATOR REMOVAL;  Surgeon: Heather Lance, MD;  Location: [facility] CATH LAB;  Service: Cardiovascular;  Laterality: N/A;  . KNEE SURGERY  01/2002   both  . left ankle surgery  08/1999  . NASAL SINUS SURGERY    . NOSE SURGERY  1987  . THYROIDECTOMY  03/29/2009   papillary cancer  . TONSILECTOMY, ADENOIDECTOMY, BILATERAL MYRINGOTOMY AND TUBES  1956  . TONSILLECTOMY    . TORE KNEE    . TOTAL ABDOMINAL HYSTERECTOMY W/ BILATERAL SALPINGOOPHORECTOMY  1988    Current Medications: Current Meds  Medication Sig  . acetaminophen (TYLENOL) 500 MG tablet Take 1,000 mg by mouth every 6 (six) hours as needed for moderate pain.  Marland Kitchen amLODipine (NORVASC) 2.5 MG tablet Take 1 tablet (2.5 mg total) by mouth daily.  Marland Kitchen aspirin 81 MG tablet Take 81 mg by mouth daily.    Marland Kitchen azelastine (ASTELIN) 137 MCG/SPRAY nasal spray Place 1 spray into the nose as directed. Use in each nostril as directed   . carvedilol (COREG) 25 MG tablet TAKE 1  TABLET TWICE A DAY WITH MEALS  . DULoxetine (CYMBALTA) 30 MG capsule Take 30 mg by mouth daily.  . fish oil-omega-3 fatty acids 1000 MG capsule Take 2 g by mouth daily.    Marland Kitchen levothyroxine (SYNTHROID, LEVOTHROID) 112 MCG tablet Take 112 mcg by mouth daily before breakfast.  . mesalamine (LIALDA) 1.2 g EC tablet Take 1.2 g by mouth daily.  . montelukast (SINGULAIR) 10 MG tablet Take 1 tablet (10 mg total) by mouth at bedtime.  . Multiple Vitamin (MULTIVITAMIN) capsule Take 1 capsule by mouth daily.    . ramipril (ALTACE) 10 MG capsule TAKE 1 CAPSULE TWICE A DAY  . rosuvastatin (CRESTOR) 5 MG tablet Take 1 tablet (5 mg total) by mouth daily.  Marland Kitchen Spacer/Aero Chamber Mouthpiece MISC 1 Device by Does not apply route as directed.  . temazepam (RESTORIL) 15 MG capsule Take 15  mg by mouth at bedtime as needed for sleep.   . vitamin C (ASCORBIC ACID) 500 MG tablet Take 500 mg by mouth daily.    . VOLTAREN 1 % GEL Apply 1 application topically at bedtime as needed (for knee pain).      Allergies:   Aspirin   Social History   Socioeconomic History  . Marital status: Divorced    Spouse name: None  . Number of children: None  . Years of education: None  . Highest education level: None  Social Needs  . Financial resource strain: None  . Food insecurity - worry: None  . Food insecurity - inability: None  . Transportation needs - medical: None  . Transportation needs - non-medical: None  Occupational History  . None  Tobacco Use  . Smoking status: Passive Smoke Exposure - Never Smoker  . Smokeless tobacco: Never Used  . Tobacco comment: Parents, siblings, and ex-husband smoked around her.  Substance and Sexual Activity  . Alcohol use: Yes    Comment: rare  . Drug use: No  . Sexual activity: None  Other Topics Concern  . None  Social History Narrative   [facility] (01/31/17):   Originally from [place]. She lived in [place] most of her life. Also lived in [place] and outside [place], [place]. She lived in [place] for 1 year as a child. Moved to [place] in 2001. 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 Previously enjoyed gardening and sewing. Has 2 dogs & 2 cats currently. Remote parakeets as a child. No mold or hot tub exposure.      Family History: The patient's family history includes Allergy (severe) in her mother; Alzheimer's disease in her mother; COPD in her daughter; Emphysema in her father; Heart disease in her brother and sister; Hypertension in her mother; Lung cancer in her brother; Thyroid disease in her daughter and daughter.  ROS:   Please see the history of present illness.    Review of Systems  Musculoskeletal: Positive for joint swelling.    All other systems reviewed and negative.   EKGs/Labs/Other  Studies Reviewed:    The following studies were reviewed today: none  EKG:  EKG is not ordered today.  Recent Labs: 12/04/2016: BUN 17; Creatinine, Ser 0.85; Hemoglobin 14.5; Platelets 259; Potassium 4.5; Sodium 142 01/15/2017: ALT 18   Recent Lipid Panel    Component Value Date/Time   CHOL 190 01/15/2017 1204   TRIG 74 01/15/2017 1204   HDL 56 01/15/2017 1204   CHOLHDL 3.4 01/15/2017 1204   CHOLHDL 2.8 11/12/2015 0755  VLDL 19 11/12/2015 0755   LDLCALC 119 (H) 01/15/2017 1204    Physical Exam:    VS:  BP 130/70   Pulse 87   Ht 5' 3"  (1.6 m)   Wt 177 lb 9.6 oz (80.6 kg)   SpO2 95%   BMI 31.46 kg/m     Wt Readings from Last 3 Encounters:  07/26/17 177 lb 9.6 oz (80.6 kg)  06/26/17 178 lb (80.7 kg)  04/03/17 180 lb (81.6 kg)     GEN:  Well nourished, well developed in no acute distress HEENT: Normal NECK: No JVD; No carotid bruits LYMPHATICS: No lymphadenopathy CARDIAC: RRR, no murmurs, rubs, gallops RESPIRATORY:  Clear to auscultation without rales, wheezing or rhonchi  ABDOMEN: Soft, non-tender, non-distended MUSCULOSKELETAL:  No edema; No deformity  SKIN: Warm and dry NEUROLOGIC:  Alert and oriented x 3 PSYCHIATRIC:  Normal affect   ASSESSMENT:    1. DCM (dilated cardiomyopathy) ([place])   2. Essential hypertension   3. SOB (shortness of breath)    PLAN:    In order of problems listed above:  1.  DCM - s/p AICD with normalization of EF and removal of ICD.  2.  HTN - BP is well controlled on exam today.  She will continue on Carvedilol 30m BID, Altace 151mdaily and amlodipine 2.2m77maily.  I will check a BMET today.    3.  Chronic DOE and CP - this has been an ongoing problem for years with normal nuclear stress tests and felt to be multifactorial from GERD, diastolic dysfunction, moderate persistent asthma, allergic rhinitis and deconditioning in the setting of knee arthritis, inactivity and further weight gain.  She is followed by pulmonary and her SOB  has improved on Singulair, Symbicort and steroid nasal spray.     Medication Adjustments/Labs and Tests Ordered: Current medicines are reviewed at length with the patient today.  Concerns regarding medicines are outlined above.  No orders of the defined types were placed in this encounter.  No orders of the defined types were placed in this encounter.   Signed, TraFransico HimD  07/26/2017 1:04 PM    [employer]

## 2017-07-26 NOTE — Patient Instructions (Signed)
Medication Instructions:  Your physician recommends that you continue on your current medications as directed. Please refer to the Current Medication list given to you today.  If you need a refill on your cardiac medications, please contact your pharmacy first.  Labwork: Today for kidney function test   Testing/Procedures: None ordered   Follow-Up: Your physician wants you to follow-up in: 1 year with Dr. Radford Pax. You will receive a reminder letter in the mail two months in advance. If you don't receive a letter, please call our office to schedule the follow-up appointment.  Any Other Special Instructions Will Be Listed Below (If Applicable).   Thank you for choosing [facility], RN  [phone]  If you need a refill on your cardiac medications before your next appointment, please call your pharmacy.

## 2017-07-27 LAB — BASIC METABOLIC PANEL
BUN / CREAT RATIO: 33 — AB (ref 12–28)
BUN: 29 mg/dL — ABNORMAL HIGH (ref 8–27)
CO2: 24 mmol/L (ref 20–29)
Calcium: 9.2 mg/dL (ref 8.7–10.3)
Chloride: 107 mmol/L — ABNORMAL HIGH (ref 96–106)
Creatinine, Ser: 0.88 mg/dL (ref 0.57–1.00)
GFR, EST AFRICAN AMERICAN: 72 mL/min/{1.73_m2} (ref >59)
GFR, EST NON AFRICAN AMERICAN: 63 mL/min/{1.73_m2} (ref >59)
Glucose: 100 mg/dL — ABNORMAL HIGH (ref 65–99)
POTASSIUM: 4.1 mmol/L (ref 3.5–5.2)
SODIUM: 146 mmol/L — AB (ref 134–144)

## 2017-09-10 DIAGNOSIS — K5150 Left sided colitis without complications: Secondary | ICD-10-CM | POA: Diagnosis not present

## 2017-09-10 DIAGNOSIS — R1013 Epigastric pain: Secondary | ICD-10-CM | POA: Diagnosis not present

## 2017-10-05 DIAGNOSIS — Z85850 Personal history of malignant neoplasm of thyroid: Secondary | ICD-10-CM | POA: Diagnosis not present

## 2017-10-05 DIAGNOSIS — E890 Postprocedural hypothyroidism: Secondary | ICD-10-CM | POA: Diagnosis not present

## 2017-10-08 DIAGNOSIS — Z85850 Personal history of malignant neoplasm of thyroid: Secondary | ICD-10-CM | POA: Diagnosis not present

## 2017-10-08 DIAGNOSIS — H04123 Dry eye syndrome of bilateral lacrimal glands: Secondary | ICD-10-CM | POA: Diagnosis not present

## 2017-10-08 DIAGNOSIS — Z808 Family history of malignant neoplasm of other organs or systems: Secondary | ICD-10-CM | POA: Diagnosis not present

## 2017-10-08 DIAGNOSIS — E890 Postprocedural hypothyroidism: Secondary | ICD-10-CM | POA: Diagnosis not present

## 2017-10-08 DIAGNOSIS — R682 Dry mouth, unspecified: Secondary | ICD-10-CM | POA: Diagnosis not present

## 2017-10-08 DIAGNOSIS — R490 Dysphonia: Secondary | ICD-10-CM | POA: Diagnosis not present

## 2017-10-08 DIAGNOSIS — Z801 Family history of malignant neoplasm of trachea, bronchus and lung: Secondary | ICD-10-CM | POA: Diagnosis not present

## 2017-10-19 DIAGNOSIS — H6122 Impacted cerumen, left ear: Secondary | ICD-10-CM | POA: Diagnosis not present

## 2017-10-19 DIAGNOSIS — S2096XA Insect bite (nonvenomous) of unspecified parts of thorax, initial encounter: Secondary | ICD-10-CM | POA: Diagnosis not present

## 2017-10-19 DIAGNOSIS — W57XXXA Bitten or stung by nonvenomous insect and other nonvenomous arthropods, initial encounter: Secondary | ICD-10-CM | POA: Diagnosis not present

## 2017-10-26 DIAGNOSIS — H6122 Impacted cerumen, left ear: Secondary | ICD-10-CM | POA: Diagnosis not present

## 2017-11-07 ENCOUNTER — Other Ambulatory Visit: Payer: Self-pay | Admitting: Gastroenterology

## 2017-11-07 DIAGNOSIS — R1310 Dysphagia, unspecified: Secondary | ICD-10-CM

## 2017-11-07 DIAGNOSIS — R1311 Dysphagia, oral phase: Secondary | ICD-10-CM | POA: Diagnosis not present

## 2017-11-07 DIAGNOSIS — K5150 Left sided colitis without complications: Secondary | ICD-10-CM | POA: Diagnosis not present

## 2017-11-08 ENCOUNTER — Ambulatory Visit
Admission: RE | Admit: 2017-11-08 | Discharge: 2017-11-08 | Disposition: A | Discharge status: Home / Self Care | Payer: Medicare Other | Source: Ambulatory Visit | Attending: Gastroenterology | Admitting: Gastroenterology

## 2017-11-08 DIAGNOSIS — K219 Gastro-esophageal reflux disease without esophagitis: Secondary | ICD-10-CM | POA: Diagnosis not present

## 2017-11-08 DIAGNOSIS — R1310 Dysphagia, unspecified: Secondary | ICD-10-CM | POA: Diagnosis not present

## 2017-12-10 DIAGNOSIS — I10 Essential (primary) hypertension: Secondary | ICD-10-CM | POA: Diagnosis not present

## 2017-12-10 DIAGNOSIS — E890 Postprocedural hypothyroidism: Secondary | ICD-10-CM | POA: Diagnosis not present

## 2017-12-10 DIAGNOSIS — E785 Hyperlipidemia, unspecified: Secondary | ICD-10-CM | POA: Diagnosis not present

## 2017-12-10 DIAGNOSIS — M170 Bilateral primary osteoarthritis of knee: Secondary | ICD-10-CM | POA: Diagnosis not present

## 2017-12-10 DIAGNOSIS — Z85850 Personal history of malignant neoplasm of thyroid: Secondary | ICD-10-CM | POA: Diagnosis not present

## 2017-12-10 DIAGNOSIS — Z0000 Encounter for general adult medical examination without abnormal findings: Secondary | ICD-10-CM | POA: Diagnosis not present

## 2017-12-10 DIAGNOSIS — I420 Dilated cardiomyopathy: Secondary | ICD-10-CM | POA: Diagnosis not present

## 2017-12-31 ENCOUNTER — Other Ambulatory Visit: Payer: Self-pay | Admitting: Physician Assistant

## 2018-01-09 DIAGNOSIS — M1712 Unilateral primary osteoarthritis, left knee: Secondary | ICD-10-CM | POA: Diagnosis not present

## 2018-01-09 DIAGNOSIS — M1711 Unilateral primary osteoarthritis, right knee: Secondary | ICD-10-CM | POA: Diagnosis not present

## 2018-01-09 DIAGNOSIS — M25562 Pain in left knee: Secondary | ICD-10-CM | POA: Diagnosis not present

## 2018-01-09 DIAGNOSIS — M25561 Pain in right knee: Secondary | ICD-10-CM | POA: Diagnosis not present

## 2018-03-19 DIAGNOSIS — H35372 Puckering of macula, left eye: Secondary | ICD-10-CM | POA: Diagnosis not present

## 2018-03-25 DIAGNOSIS — K5150 Left sided colitis without complications: Secondary | ICD-10-CM | POA: Diagnosis not present

## 2018-03-25 DIAGNOSIS — R1311 Dysphagia, oral phase: Secondary | ICD-10-CM | POA: Diagnosis not present

## 2018-03-28 ENCOUNTER — Other Ambulatory Visit: Payer: Self-pay | Admitting: Student

## 2018-04-09 DIAGNOSIS — Z23 Encounter for immunization: Secondary | ICD-10-CM | POA: Diagnosis not present

## 2018-07-29 DIAGNOSIS — M25551 Pain in right hip: Secondary | ICD-10-CM | POA: Diagnosis not present

## 2018-07-29 DIAGNOSIS — M1611 Unilateral primary osteoarthritis, right hip: Secondary | ICD-10-CM | POA: Diagnosis not present

## 2018-07-29 DIAGNOSIS — M1712 Unilateral primary osteoarthritis, left knee: Secondary | ICD-10-CM | POA: Diagnosis not present

## 2018-07-29 DIAGNOSIS — M1711 Unilateral primary osteoarthritis, right knee: Secondary | ICD-10-CM | POA: Diagnosis not present

## 2018-07-31 DIAGNOSIS — R3915 Urgency of urination: Secondary | ICD-10-CM | POA: Diagnosis not present

## 2018-07-31 DIAGNOSIS — Z131 Encounter for screening for diabetes mellitus: Secondary | ICD-10-CM | POA: Diagnosis not present

## 2018-07-31 DIAGNOSIS — N3281 Overactive bladder: Secondary | ICD-10-CM | POA: Diagnosis not present

## 2018-08-02 ENCOUNTER — Telehealth: Payer: Self-pay | Admitting: *Deleted

## 2018-08-02 NOTE — Telephone Encounter (Signed)
   [facility] Pre-operative Risk Assessment    Request for surgical clearance:  1. What type of surgery is being performed? RIGHT TOTAL HIP   2. When is this surgery scheduled? 09/17/18   3. What type of clearance is required (medical clearance vs. Pharmacy clearance to hold med vs. Both)? MEDICAL  4. Are there any medications that need to be held prior to surgery and how long?ASA X 7 DAYS PRIOR   5. Practice name and name of physician performing surgery? [facility]; DR. Alvan Dame   6. What is your office phone number [phone]    7.   What is your office fax number [phone]  8.   Anesthesia type (None, local, MAC, general) ? SPINAL   [name] 08/02/2018, 11:16 AM  _________________________________________________________________   (provider comments below)

## 2018-08-02 NOTE — Telephone Encounter (Signed)
Left voicemail to return call. Will route to Dr. Radford Pax to comment on ASA.

## 2018-08-02 NOTE — Telephone Encounter (Signed)
LM2CB-Needs appt scheduled

## 2018-08-02 NOTE — Telephone Encounter (Signed)
   Primary Cardiologist:Traci Turner, MD  Chart reviewed as part of pre-operative protocol coverage. Because of [name] past medical history and time since last visit, he/she will require a follow-up visit in order to better assess preoperative cardiovascular risk.  Pre-op covering staff: - Please schedule appointment and call patient to inform them. - Please contact requesting surgeon's office via preferred method (i.e, phone, fax) to inform them of need for appointment prior to surgery.  If applicable, this message will also be routed to pharmacy pool and/or primary cardiologist for input on holding anticoagulant/antiplatelet agent as requested below so that this information is available at time of patient's appointment.   Ledora Bottcher, PA  08/02/2018, 2:56 PM

## 2018-08-03 NOTE — Telephone Encounter (Signed)
I'm not sure why she is on ASA with no CAD.  OK to hold ASA for procedure

## 2018-08-05 ENCOUNTER — Other Ambulatory Visit: Payer: Self-pay

## 2018-08-05 ENCOUNTER — Emergency Department (HOSPITAL_COMMUNITY)
Admission: EM | Admit: 2018-08-05 | Discharge: 2018-08-05 | Disposition: A | Discharge status: Home / Self Care | Payer: Medicare Other | Attending: Emergency Medicine | Admitting: Emergency Medicine

## 2018-08-05 ENCOUNTER — Emergency Department (HOSPITAL_COMMUNITY): Payer: Medicare Other

## 2018-08-05 ENCOUNTER — Encounter (HOSPITAL_COMMUNITY): Payer: Self-pay

## 2018-08-05 DIAGNOSIS — Z7982 Long term (current) use of aspirin: Secondary | ICD-10-CM | POA: Diagnosis not present

## 2018-08-05 DIAGNOSIS — Z79899 Other long term (current) drug therapy: Secondary | ICD-10-CM | POA: Diagnosis not present

## 2018-08-05 DIAGNOSIS — I10 Essential (primary) hypertension: Secondary | ICD-10-CM | POA: Insufficient documentation

## 2018-08-05 DIAGNOSIS — R079 Chest pain, unspecified: Secondary | ICD-10-CM | POA: Diagnosis present

## 2018-08-05 DIAGNOSIS — R0789 Other chest pain: Secondary | ICD-10-CM | POA: Insufficient documentation

## 2018-08-05 DIAGNOSIS — R05 Cough: Secondary | ICD-10-CM | POA: Diagnosis not present

## 2018-08-05 DIAGNOSIS — R52 Pain, unspecified: Secondary | ICD-10-CM | POA: Diagnosis not present

## 2018-08-05 DIAGNOSIS — Z7722 Contact with and (suspected) exposure to environmental tobacco smoke (acute) (chronic): Secondary | ICD-10-CM | POA: Diagnosis not present

## 2018-08-05 MED ORDER — HYDROCODONE-ACETAMINOPHEN 5-325 MG PO TABS
1.0000 | ORAL_TABLET | Freq: Once | ORAL | Status: AC
  Administered 2018-08-05: 1 via ORAL
  Filled 2018-08-05: qty 1

## 2018-08-05 MED ORDER — HYDROCODONE-ACETAMINOPHEN 5-325 MG PO TABS: 1.0000 | ORAL_TABLET | Freq: Four times a day (QID) | ORAL | 0 refills | Status: DC | PRN

## 2018-08-05 NOTE — ED Triage Notes (Signed)
Per ems pt having right side rib pain for 3 days. Today pain 8/10 and wraps around to right chest. Pain is worse when going over bumps or making sudden movements

## 2018-08-05 NOTE — ED Provider Notes (Signed)
[facility] EMERGENCY DEPARTMENT Provider Note   CSN: [id number] Arrival date & time: 08/05/18  1518    History   Chief Complaint Chief Complaint  Patient presents with  . Chest Pain    HPI [name] is a 83 y.o. female.     HPI Patient with history of seasonal allergies.  States she has been coughing more over the last few days has had several severe coughing spells.  She has had right lateral chest pain for the past 3 days.  The pain is worse with movement and deep breathing.  Pain acutely worsened this afternoon after waking up from a nap.  She denies any fever or chills.  Denies abdominal pain.  No back pain.  Denies any known direct trauma to the chest.  No recent travel, immobilization, hospitalizations.  States she is been taking Tylenol at home with minimal improvement. Past Medical History:  Diagnosis Date  . Ankle fracture   . Colitis   . DCM (dilated cardiomyopathy) ([place])    a. previously idiopathic with EF 20% in 2006 s/p ICD with subsequent normalization of EF and removal of defibrillator  . Depression   . Diastolic dysfunction   . Diverticula, colon   . Dyslipidemia   . Fecal incontinence    mild  . GERD (gastroesophageal reflux disease)   . Hypertension   . Mild aortic insufficiency   . Normal coronary arteries 2006  . Osteoarthritis    both knees  . Overactive bladder   . Post-surgical hypothyroidism   . Pulmonary nodule   . Seasonal allergies   . Thyroid nodule    papillary cancer    Patient Active Problem List   Diagnosis Date Noted  . Moderate persistent asthma 04/03/2017  . Ulcerative colitis ([place]) 01/31/2017  . Arthritis 01/31/2017  . Sicca ([place]) 01/31/2017  . Diastolic dysfunction without heart failure 11/29/2016  . SOB (shortness of breath) 09/01/2015  . GERD (gastroesophageal reflux disease)   . DCM (dilated cardiomyopathy) ([place])   . Dyslipidemia   . Automatic implantable cardioverter-defibrillator in situ  02/23/2011  . Thyroid cancer, follicular variant papillary carcinoma, T1b, Nx, Mx 02/20/2011  . ARTHRPATH W/OTH CONDS CLASSIFIABLE ELSEWHERE 10/22/2008  . Allergic rhinitis 10/23/2007  . PULMONARY NODULE 10/11/2007  . Essential hypertension 10/10/2007    Past Surgical History:  Procedure Laterality Date  . ABDOMINAL HYSTERECTOMY    . BROKEN ANKLE    . CARDIAC DEFIBRILLATOR PLACEMENT    . GENERATOR REMOVAL N/A 07/20/2014   Procedure: ICD GENERATOR REMOVAL;  Surgeon: Evans Lance, MD;  Location: [facility] CATH LAB;  Service: Cardiovascular;  Laterality: N/A;  . KNEE SURGERY  01/2002   both  . left ankle surgery  08/1999  . NASAL SINUS SURGERY    . NOSE SURGERY  1987  . THYROIDECTOMY  03/29/2009   papillary cancer  . TONSILECTOMY, ADENOIDECTOMY, BILATERAL MYRINGOTOMY AND TUBES  1956  . TONSILLECTOMY    . TORE KNEE    . TOTAL ABDOMINAL HYSTERECTOMY W/ BILATERAL SALPINGOOPHORECTOMY  1988     OB History   No obstetric history on file.      Home Medications    Prior to Admission medications   Medication Sig Start Date End Date Taking? Authorizing Provider  acetaminophen (TYLENOL) 500 MG tablet Take 1,000 mg by mouth every 6 (six) hours as needed for moderate pain.    [provider]  amLODipine (NORVASC) 2.5 MG tablet TAKE 1 TABLET DAILY 12/31/17   Melina Copa  N, PA-C  aspirin 81 MG tablet Take 81 mg by mouth daily.      [provider]  azelastine (ASTELIN) 137 MCG/SPRAY nasal spray Place 1 spray into the nose as directed. Use in each nostril as directed     [provider]  carvedilol (COREG) 25 MG tablet TAKE 1 TABLET TWICE A DAY WITH MEALS 04/16/17   Turner, Eber Hong, MD  DULoxetine (CYMBALTA) 30 MG capsule Take 30 mg by mouth daily.    [provider]  fish oil-omega-3 fatty acids 1000 MG capsule Take 2 g by mouth daily.      [provider]  HYDROcodone-acetaminophen (NORCO) 5-325 MG tablet Take 1 tablet by mouth every 6 (six) hours as  needed for severe pain. 08/05/18   Julianne Rice, MD  levothyroxine (SYNTHROID, LEVOTHROID) 112 MCG tablet Take 112 mcg by mouth daily before breakfast.    [provider]  mesalamine (LIALDA) 1.2 g EC tablet Take 1.2 g by mouth daily. 11/15/16   [provider]  montelukast (SINGULAIR) 10 MG tablet Take 1 tablet (10 mg total) by mouth at bedtime. 05/31/17   Parrett, Fonnie Mu, NP  Multiple Vitamin (MULTIVITAMIN) capsule Take 1 capsule by mouth daily.      [provider]  ramipril (ALTACE) 10 MG capsule TAKE 1 CAPSULE TWICE A DAY 04/16/17   Sueanne Margarita, MD  rosuvastatin (CRESTOR) 5 MG tablet TAKE 1 TABLET DAILY 03/29/18   Sueanne Margarita, MD  Spacer/Aero Chamber Mouthpiece MISC 1 Device by Does not apply route as directed. 04/03/17   Javier Glazier, MD  temazepam (RESTORIL) 15 MG capsule Take 15 mg by mouth at bedtime as needed for sleep.     [provider]  vitamin C (ASCORBIC ACID) 500 MG tablet Take 500 mg by mouth daily.      [provider]  VOLTAREN 1 % GEL Apply 1 application topically at bedtime as needed (for knee pain).  11/19/14   [provider]    Family History Family History  Problem Relation Age of Onset  . Emphysema Father   . Hypertension Mother   . Alzheimer's disease Mother   . Allergy (severe) Mother   . Lung cancer Brother   . Heart disease Brother   . Heart disease Sister   . COPD Daughter   . Thyroid disease Daughter   . Thyroid disease Daughter     Social History Social History   Tobacco Use  . Smoking status: Passive Smoke Exposure - Never Smoker  . Smokeless tobacco: Never Used  . Tobacco comment: Parents, siblings, and ex-husband smoked around her.  Substance Use Topics  . Alcohol use: Yes    Comment: rare  . Drug use: No     Allergies   Aspirin   Review of Systems Review of Systems  Constitutional: Negative for chills and fever.  HENT: Negative for sore throat and trouble  swallowing.   Eyes: Negative for visual disturbance.  Respiratory: Positive for cough. Negative for shortness of breath.   Cardiovascular: Negative for chest pain, palpitations and leg swelling.  Gastrointestinal: Negative for abdominal pain, diarrhea, nausea and vomiting.  Musculoskeletal: Negative for back pain and neck pain.  Skin: Negative for rash and wound.  Neurological: Negative for dizziness, weakness, light-headedness, numbness and headaches.  All other systems reviewed and are negative.    Physical Exam Updated Vital Signs BP (!) 160/68 (BP Location: Right Arm)   Pulse 72   Temp 98.8  F (37.1 C) (Oral)   Resp 16   Ht 5\' 2"  (1.575 m)   Wt 83.5 kg   SpO2 98%   BMI 33.65 kg/m   Physical Exam Vitals signs and nursing note reviewed.  Constitutional:      General: She is not in acute distress.    Appearance: Normal appearance. She is well-developed. She is not ill-appearing.  HENT:     Head: Normocephalic and atraumatic.     Mouth/Throat:     Mouth: Mucous membranes are moist.  Eyes:     Extraocular Movements: Extraocular movements intact.     Pupils: Pupils are equal, round, and reactive to light.  Neck:     Musculoskeletal: Normal range of motion and neck supple. No neck rigidity or muscular tenderness.     Comments: No posterior midline cervical tenderness to palpation. Cardiovascular:     Rate and Rhythm: Normal rate and regular rhythm.     Heart sounds: No murmur. No friction rub. No gallop.   Pulmonary:     Effort: Pulmonary effort is normal. No respiratory distress.     Breath sounds: Normal breath sounds. No stridor. No wheezing, rhonchi or rales.     Comments: Patient has point tenderness over the right lateral midline chest wall at approximately the fifth rib level.  No crepitance or deformity. Chest:     Chest wall: Tenderness present.  Abdominal:     General: Bowel sounds are normal. There is no distension.     Palpations: Abdomen is soft.      Tenderness: There is no abdominal tenderness. There is no right CVA tenderness, left CVA tenderness, guarding or rebound.  Musculoskeletal: Normal range of motion.        General: No swelling, tenderness, deformity or signs of injury.     Right lower leg: No edema.     Left lower leg: No edema.     Comments: No midline thoracic or lumbar tenderness.  Distal pulses intact.  No lower extremity swelling, asymmetry or tenderness.  Lymphadenopathy:     Cervical: No cervical adenopathy.  Skin:    General: Skin is warm and dry.     Capillary Refill: Capillary refill takes less than 2 seconds.     Findings: No erythema or rash.  Neurological:     General: No focal deficit present.     Mental Status: She is alert and oriented to person, place, and time.  Psychiatric:        Mood and Affect: Mood normal.        Behavior: Behavior normal.      ED Treatments / Results  Labs (all labs ordered are listed, but only abnormal results are displayed) Labs Reviewed - No data to display  EKG None  Radiology Dg Ribs Unilateral W/chest Right  Result Date: 08/05/2018 CLINICAL DATA:  Right chest wall pain after waking up from a nap. No known injury. EXAM: RIGHT RIBS AND CHEST - 3+ VIEW COMPARISON:  Chest x-ray dated June 26, 2017. FINDINGS: No fracture or other bone lesions are seen involving the ribs. Unchanged defibrillator wires with prior left chest wall pacemaker generator removal. Stable mild cardiomegaly. Normal pulmonary vascularity. No focal consolidation, pleural effusion, or pneumothorax. Unchanged left greater than right glenohumeral osteoarthritis with possible avascular necrosis of the left humeral head. IMPRESSION: 1. No acute rib abnormality. 2.  No active cardiopulmonary disease. Electronically Signed   By: Titus Dubin M.D.   On: 08/05/2018 17:20    Procedures Procedures (  including critical care time)  Medications Ordered in ED Medications  HYDROcodone-acetaminophen  (NORCO/VICODIN) 5-325 MG per tablet 1 tablet (1 tablet Oral Given 08/05/18 1605)     Initial Impression / Assessment and Plan / ED Course  I have reviewed the triage vital signs and the nursing notes.  Pertinent labs & imaging results that were available during my care of the patient were reviewed by me and considered in my medical decision making (see chart for details).        Suspect musculoskeletal cause for the patient's symptoms.  Will get x-ray to confirm no obvious pulmonary pathology and screen with EKG. Chest x-ray without acute findings.  Patient pain is improved after medication.  Given incentive spirometer.  Encouraged to take deep breaths.  Advised to follow-up with her primary physician and return precautions given. Final Clinical Impressions(s) / ED Diagnoses   Final diagnoses:  Right-sided chest wall pain    ED Discharge Orders         Ordered    HYDROcodone-acetaminophen (NORCO) 5-325 MG tablet  Every 6 hours PRN     08/05/18 1844           Julianne Rice, MD 08/05/18 [phone]

## 2018-08-05 NOTE — ED Notes (Signed)
Performed IS teaching along with DC teaching. Pt with no questions at this time. PT left in [place] with no s/sx acute distress

## 2018-08-06 NOTE — Telephone Encounter (Signed)
Pt is scheduled to see Ermalinda Barrios PA- C on 08/21/2018. Clearance will be addressed at visit. I have notified requesting office.

## 2018-08-09 DIAGNOSIS — E890 Postprocedural hypothyroidism: Secondary | ICD-10-CM | POA: Diagnosis not present

## 2018-08-09 DIAGNOSIS — E119 Type 2 diabetes mellitus without complications: Secondary | ICD-10-CM | POA: Diagnosis not present

## 2018-08-09 DIAGNOSIS — Z85850 Personal history of malignant neoplasm of thyroid: Secondary | ICD-10-CM | POA: Diagnosis not present

## 2018-08-19 ENCOUNTER — Encounter: Payer: Self-pay | Admitting: Physician Assistant

## 2018-08-21 ENCOUNTER — Ambulatory Visit: Payer: Medicare Other | Admitting: Physician Assistant

## 2018-08-28 ENCOUNTER — Telehealth: Payer: Self-pay | Admitting: Physician Assistant

## 2018-08-28 NOTE — Telephone Encounter (Signed)
I spoke with patient about upcoming appointment with me 09/04/2018 due to recent corona outbreak patient was on my schedule for preop clearance and her surgery has been canceled.  She is not proficient on the computer or smart phone and would like to reschedule her preop/yearly visit in May or June.  She is a patient of Dr. Radford Pax so can see her or myself.

## 2018-08-31 NOTE — Telephone Encounter (Signed)
Attempted to contact patient but there was no answer and VM did not pick up. Patient needs to reschedule appointment with Selinda Eon in May or June.

## 2018-09-03 NOTE — Telephone Encounter (Signed)
Patient has been rescheduled for 5/12

## 2018-09-04 ENCOUNTER — Ambulatory Visit: Payer: Medicare Other | Admitting: Physician Assistant

## 2018-09-17 ENCOUNTER — Inpatient Hospital Stay: Admit: 2018-09-17 | Payer: Medicare Other | Admitting: Orthopedic Surgery

## 2018-09-17 SURGERY — ARTHROPLASTY, HIP, TOTAL, ANTERIOR APPROACH
Anesthesia: Spinal | Laterality: Right

## 2018-09-20 ENCOUNTER — Emergency Department (HOSPITAL_COMMUNITY): Payer: Medicare Other

## 2018-09-20 ENCOUNTER — Inpatient Hospital Stay (HOSPITAL_COMMUNITY)
Admission: EM | Admit: 2018-09-20 | Discharge: 2018-09-21 | DRG: 809 | Disposition: A | Discharge status: Other Acute Inpatient Hospital | Payer: Medicare Other | Attending: Internal Medicine | Admitting: Internal Medicine

## 2018-09-20 ENCOUNTER — Encounter (HOSPITAL_COMMUNITY): Payer: Self-pay | Admitting: Emergency Medicine

## 2018-09-20 DIAGNOSIS — J329 Chronic sinusitis, unspecified: Secondary | ICD-10-CM | POA: Diagnosis not present

## 2018-09-20 DIAGNOSIS — J3489 Other specified disorders of nose and nasal sinuses: Secondary | ICD-10-CM

## 2018-09-20 DIAGNOSIS — R630 Anorexia: Secondary | ICD-10-CM | POA: Diagnosis present

## 2018-09-20 DIAGNOSIS — Z20828 Contact with and (suspected) exposure to other viral communicable diseases: Secondary | ICD-10-CM | POA: Diagnosis present

## 2018-09-20 DIAGNOSIS — R911 Solitary pulmonary nodule: Secondary | ICD-10-CM | POA: Diagnosis present

## 2018-09-20 DIAGNOSIS — I110 Hypertensive heart disease with heart failure: Secondary | ICD-10-CM | POA: Diagnosis present

## 2018-09-20 DIAGNOSIS — N3000 Acute cystitis without hematuria: Secondary | ICD-10-CM | POA: Diagnosis not present

## 2018-09-20 DIAGNOSIS — E785 Hyperlipidemia, unspecified: Secondary | ICD-10-CM | POA: Diagnosis present

## 2018-09-20 DIAGNOSIS — H539 Unspecified visual disturbance: Secondary | ICD-10-CM | POA: Diagnosis not present

## 2018-09-20 DIAGNOSIS — K51911 Ulcerative colitis, unspecified with rectal bleeding: Secondary | ICD-10-CM | POA: Diagnosis not present

## 2018-09-20 DIAGNOSIS — Z79899 Other long term (current) drug therapy: Secondary | ICD-10-CM

## 2018-09-20 DIAGNOSIS — R0600 Dyspnea, unspecified: Secondary | ICD-10-CM

## 2018-09-20 DIAGNOSIS — Z85850 Personal history of malignant neoplasm of thyroid: Secondary | ICD-10-CM

## 2018-09-20 DIAGNOSIS — R52 Pain, unspecified: Secondary | ICD-10-CM | POA: Diagnosis not present

## 2018-09-20 DIAGNOSIS — K219 Gastro-esophageal reflux disease without esophagitis: Secondary | ICD-10-CM | POA: Diagnosis present

## 2018-09-20 DIAGNOSIS — N3281 Overactive bladder: Secondary | ICD-10-CM | POA: Diagnosis present

## 2018-09-20 DIAGNOSIS — Z90710 Acquired absence of both cervix and uterus: Secondary | ICD-10-CM

## 2018-09-20 DIAGNOSIS — N390 Urinary tract infection, site not specified: Secondary | ICD-10-CM | POA: Diagnosis present

## 2018-09-20 DIAGNOSIS — I10 Essential (primary) hypertension: Secondary | ICD-10-CM | POA: Diagnosis present

## 2018-09-20 DIAGNOSIS — I959 Hypotension, unspecified: Secondary | ICD-10-CM | POA: Diagnosis not present

## 2018-09-20 DIAGNOSIS — D649 Anemia, unspecified: Secondary | ICD-10-CM | POA: Diagnosis present

## 2018-09-20 DIAGNOSIS — I5032 Chronic diastolic (congestive) heart failure: Secondary | ICD-10-CM | POA: Diagnosis present

## 2018-09-20 DIAGNOSIS — Z825 Family history of asthma and other chronic lower respiratory diseases: Secondary | ICD-10-CM

## 2018-09-20 DIAGNOSIS — D61818 Other pancytopenia: Secondary | ICD-10-CM | POA: Diagnosis not present

## 2018-09-20 DIAGNOSIS — I420 Dilated cardiomyopathy: Secondary | ICD-10-CM | POA: Diagnosis present

## 2018-09-20 DIAGNOSIS — I447 Left bundle-branch block, unspecified: Secondary | ICD-10-CM | POA: Diagnosis not present

## 2018-09-20 DIAGNOSIS — K922 Gastrointestinal hemorrhage, unspecified: Secondary | ICD-10-CM | POA: Diagnosis present

## 2018-09-20 DIAGNOSIS — D693 Immune thrombocytopenic purpura: Secondary | ICD-10-CM

## 2018-09-20 DIAGNOSIS — E890 Postprocedural hypothyroidism: Secondary | ICD-10-CM | POA: Diagnosis present

## 2018-09-20 DIAGNOSIS — R740 Nonspecific elevation of levels of transaminase and lactic acid dehydrogenase [LDH]: Secondary | ICD-10-CM | POA: Diagnosis present

## 2018-09-20 DIAGNOSIS — R0602 Shortness of breath: Secondary | ICD-10-CM | POA: Diagnosis not present

## 2018-09-20 DIAGNOSIS — Z8249 Family history of ischemic heart disease and other diseases of the circulatory system: Secondary | ICD-10-CM

## 2018-09-20 DIAGNOSIS — R51 Headache: Secondary | ICD-10-CM | POA: Diagnosis not present

## 2018-09-20 DIAGNOSIS — D696 Thrombocytopenia, unspecified: Secondary | ICD-10-CM | POA: Diagnosis not present

## 2018-09-20 DIAGNOSIS — K5190 Ulcerative colitis, unspecified, without complications: Secondary | ICD-10-CM | POA: Diagnosis present

## 2018-09-20 DIAGNOSIS — Z801 Family history of malignant neoplasm of trachea, bronchus and lung: Secondary | ICD-10-CM

## 2018-09-20 DIAGNOSIS — R531 Weakness: Secondary | ICD-10-CM | POA: Diagnosis present

## 2018-09-20 DIAGNOSIS — Z820 Family history of epilepsy and other diseases of the nervous system: Secondary | ICD-10-CM

## 2018-09-20 DIAGNOSIS — Z7982 Long term (current) use of aspirin: Secondary | ICD-10-CM

## 2018-09-20 DIAGNOSIS — Z79890 Hormone replacement therapy (postmenopausal): Secondary | ICD-10-CM

## 2018-09-20 DIAGNOSIS — Z95810 Presence of automatic (implantable) cardiac defibrillator: Secondary | ICD-10-CM

## 2018-09-20 DIAGNOSIS — R0902 Hypoxemia: Secondary | ICD-10-CM | POA: Diagnosis not present

## 2018-09-20 DIAGNOSIS — E039 Hypothyroidism, unspecified: Secondary | ICD-10-CM | POA: Diagnosis present

## 2018-09-20 DIAGNOSIS — Z90722 Acquired absence of ovaries, bilateral: Secondary | ICD-10-CM

## 2018-09-20 LAB — TRIGLYCERIDES: Triglycerides: 108 mg/dL (ref <150)

## 2018-09-20 LAB — COMPREHENSIVE METABOLIC PANEL
ALT: 10 U/L (ref 0–44)
AST: 18 U/L (ref 15–41)
Albumin: 3.1 g/dL — ABNORMAL LOW (ref 3.5–5.0)
Alkaline Phosphatase: 51 U/L (ref 38–126)
Anion gap: 7 (ref 5–15)
BUN: 33 mg/dL — ABNORMAL HIGH (ref 8–23)
CO2: 21 mmol/L — ABNORMAL LOW (ref 22–32)
Calcium: 8.7 mg/dL — ABNORMAL LOW (ref 8.9–10.3)
Chloride: 109 mmol/L (ref 98–111)
Creatinine, Ser: 0.95 mg/dL (ref 0.44–1.00)
GFR calc Af Amer: >60 mL/min (ref >60)
GFR calc non Af Amer: 57 mL/min — ABNORMAL LOW (ref >60)
Glucose, Bld: 151 mg/dL — ABNORMAL HIGH (ref 70–99)
Potassium: 5.1 mmol/L (ref 3.5–5.1)
Sodium: 137 mmol/L (ref 135–145)
Total Bilirubin: 0.6 mg/dL (ref 0.3–1.2)
Total Protein: 6.4 g/dL — ABNORMAL LOW (ref 6.5–8.1)

## 2018-09-20 LAB — CBC WITH DIFFERENTIAL/PLATELET
Abs Immature Granulocytes: 0.02 10*3/uL (ref 0.00–0.07)
Basophils Absolute: 0 10*3/uL (ref 0.0–0.1)
Basophils Relative: 0 %
Eosinophils Absolute: 0 10*3/uL (ref 0.0–0.5)
Eosinophils Relative: 1 %
HCT: 12.6 % — ABNORMAL LOW (ref 36.0–46.0)
Hemoglobin: 4 g/dL — CL (ref 12.0–15.0)
Immature Granulocytes: 1 %
Lymphocytes Relative: 77 %
Lymphs Abs: 2.9 10*3/uL (ref 0.7–4.0)
MCH: 31.5 pg (ref 26.0–34.0)
MCHC: 31.7 g/dL (ref 30.0–36.0)
MCV: 99.2 fL (ref 80.0–100.0)
Monocytes Absolute: 0.5 10*3/uL (ref 0.1–1.0)
Monocytes Relative: 14 %
Neutro Abs: 0.3 10*3/uL — ABNORMAL LOW (ref 1.7–7.7)
Neutrophils Relative %: 7 %
Platelets: 3 10*3/uL — CL (ref 150–400)
RBC: 1.27 MIL/uL — ABNORMAL LOW (ref 3.87–5.11)
RDW: 15.9 % — ABNORMAL HIGH (ref 11.5–15.5)
WBC: 3.7 10*3/uL — ABNORMAL LOW (ref 4.0–10.5)
nRBC: 3.5 % — ABNORMAL HIGH (ref 0.0–0.2)

## 2018-09-20 LAB — SARS CORONAVIRUS 2 BY RT PCR (HOSPITAL ORDER, PERFORMED IN ~~LOC~~ HOSPITAL LAB): SARS Coronavirus 2: NEGATIVE

## 2018-09-20 LAB — RETICULOCYTES
Immature Retic Fract: 26.4 % — ABNORMAL HIGH (ref 2.3–15.9)
RBC.: 1.26 MIL/uL — ABNORMAL LOW (ref 3.87–5.11)
Retic Count, Absolute: 4.2 10*3/uL — ABNORMAL LOW (ref 19.0–186.0)
Retic Ct Pct: 0.3 % — ABNORMAL LOW (ref 0.4–3.1)

## 2018-09-20 LAB — LACTIC ACID, PLASMA: Lactic Acid, Venous: 2.2 mmol/L (ref 0.5–1.9)

## 2018-09-20 LAB — LACTATE DEHYDROGENASE: LDH: 515 U/L — ABNORMAL HIGH (ref 98–192)

## 2018-09-20 LAB — C-REACTIVE PROTEIN: CRP: 9.3 mg/dL — ABNORMAL HIGH (ref <1.0)

## 2018-09-20 LAB — BRAIN NATRIURETIC PEPTIDE: B Natriuretic Peptide: 167 pg/mL — ABNORMAL HIGH (ref 0.0–100.0)

## 2018-09-20 LAB — D-DIMER, QUANTITATIVE: D-Dimer, Quant: 2.59 ug/mL-FEU — ABNORMAL HIGH (ref 0.00–0.50)

## 2018-09-20 LAB — FIBRINOGEN: Fibrinogen: 568 mg/dL — ABNORMAL HIGH (ref 210–475)

## 2018-09-20 MED ORDER — SODIUM CHLORIDE 0.9% IV SOLUTION: Freq: Once | INTRAVENOUS | Status: DC

## 2018-09-20 NOTE — ED Notes (Signed)
Urine and urine culture sent to lab

## 2018-09-20 NOTE — ED Provider Notes (Addendum)
[facility] DEPT Provider Note   CSN: [id number] Arrival date & time: 09/20/18  2131    History   Chief Complaint Chief Complaint  Patient presents with  . Recurrent Sinusitis    HPI [name] is a 83 y.o. female.     HPI  83 year old female presents today stating that she is having sinusitis that began several days ago and increasing dyspnea.  She reports that she began blowing bloody mucus out of her nose.  She was seen by her primary care physician and started on antibiotics this week, but she was unable to get filled.  She has continued to have some nasal discharge.  She states that she has also had worsening dyspnea over months to weeks.  She reports that she is in the hospital because her daughter, who is out of town, called EMS.  She states that when she went to the door she became very dyspneic and was subsequently transferred to the ED.  She states that she has had some ongoing pain in her right breast and her right hip.  She states that this is been present since early March.  She denies any fever or cough.  She denies any immobilization, other risk factors for PE, or lateralized leg swelling.  She is on daily aspirin.  She denies any known COVID exposures or recent travel. Patient states she had vision change in both eyes which she attributes to "floaters" with the left worse than the right Reports decreased po intake due to poor appetite.  NO nausea, vomiting or diarrhea  Past Medical History:  Diagnosis Date  . Ankle fracture   . Colitis   . DCM (dilated cardiomyopathy) ([place])    a. previously idiopathic with EF 20% in 2006 s/p ICD with subsequent normalization of EF and removal of defibrillator  . Depression   . Diastolic dysfunction   . Diverticula, colon   . Dyslipidemia   . Fecal incontinence    mild  . GERD (gastroesophageal reflux disease)   . Hypertension   . Mild aortic insufficiency   . Normal coronary arteries 2006   . Osteoarthritis    both knees  . Overactive bladder   . Post-surgical hypothyroidism   . Pulmonary nodule   . Seasonal allergies   . Thyroid nodule    papillary cancer    Patient Active Problem List   Diagnosis Date Noted  . Moderate persistent asthma 04/03/2017  . Ulcerative colitis ([place]) 01/31/2017  . Arthritis 01/31/2017  . Sicca ([place]) 01/31/2017  . Diastolic dysfunction without heart failure 11/29/2016  . SOB (shortness of breath) 09/01/2015  . GERD (gastroesophageal reflux disease)   . DCM (dilated cardiomyopathy) ([place])   . Dyslipidemia   . Automatic implantable cardioverter-defibrillator in situ 02/23/2011  . Thyroid cancer, follicular variant papillary carcinoma, T1b, Nx, Mx 02/20/2011  . ARTHRPATH W/OTH CONDS CLASSIFIABLE ELSEWHERE 10/22/2008  . Allergic rhinitis 10/23/2007  . PULMONARY NODULE 10/11/2007  . Essential hypertension 10/10/2007    Past Surgical History:  Procedure Laterality Date  . ABDOMINAL HYSTERECTOMY    . BROKEN ANKLE    . CARDIAC DEFIBRILLATOR PLACEMENT    . GENERATOR REMOVAL N/A 07/20/2014   Procedure: ICD GENERATOR REMOVAL;  Surgeon: Evans Lance, MD;  Location: [facility] CATH LAB;  Service: Cardiovascular;  Laterality: N/A;  . KNEE SURGERY  01/2002   both  . left ankle surgery  08/1999  . NASAL SINUS SURGERY    . NOSE SURGERY  1987  .  THYROIDECTOMY  03/29/2009   papillary cancer  . TONSILECTOMY, ADENOIDECTOMY, BILATERAL MYRINGOTOMY AND TUBES  1956  . TONSILLECTOMY    . TORE KNEE    . TOTAL ABDOMINAL HYSTERECTOMY W/ BILATERAL SALPINGOOPHORECTOMY  1988     OB History   No obstetric history on file.      Home Medications    Prior to Admission medications   Medication Sig Start Date End Date Taking? Authorizing Provider  acetaminophen (TYLENOL) 500 MG tablet Take 1,000 mg by mouth every 6 (six) hours as needed for moderate pain.    [provider]  amLODipine (NORVASC) 2.5 MG tablet TAKE 1 TABLET DAILY 12/31/17   Charlie Pitter,  PA-C  aspirin 81 MG tablet Take 81 mg by mouth daily.      [provider]  azelastine (ASTELIN) 137 MCG/SPRAY nasal spray Place 1 spray into the nose as directed. Use in each nostril as directed     [provider]  carvedilol (COREG) 25 MG tablet TAKE 1 TABLET TWICE A DAY WITH MEALS 04/16/17   Turner, Eber Hong, MD  DULoxetine (CYMBALTA) 30 MG capsule Take 30 mg by mouth daily.    [provider]  fish oil-omega-3 fatty acids 1000 MG capsule Take 2 g by mouth daily.      [provider]  HYDROcodone-acetaminophen (NORCO) 5-325 MG tablet Take 1 tablet by mouth every 6 (six) hours as needed for severe pain. 08/05/18   Julianne Rice, MD  levothyroxine (SYNTHROID, LEVOTHROID) 112 MCG tablet Take 112 mcg by mouth daily before breakfast.    [provider]  mesalamine (LIALDA) 1.2 g EC tablet Take 1.2 g by mouth daily. 11/15/16   [provider]  montelukast (SINGULAIR) 10 MG tablet Take 1 tablet (10 mg total) by mouth at bedtime. 05/31/17   Parrett, Fonnie Mu, NP  Multiple Vitamin (MULTIVITAMIN) capsule Take 1 capsule by mouth daily.      [provider]  ramipril (ALTACE) 10 MG capsule TAKE 1 CAPSULE TWICE A DAY 04/16/17   Sueanne Margarita, MD  rosuvastatin (CRESTOR) 5 MG tablet TAKE 1 TABLET DAILY 03/29/18   Sueanne Margarita, MD  Spacer/Aero Chamber Mouthpiece MISC 1 Device by Does not apply route as directed. 04/03/17   Javier Glazier, MD  temazepam (RESTORIL) 15 MG capsule Take 15 mg by mouth at bedtime as needed for sleep.     [provider]  vitamin C (ASCORBIC ACID) 500 MG tablet Take 500 mg by mouth daily.      [provider]  VOLTAREN 1 % GEL Apply 1 application topically at bedtime as needed (for knee pain).  11/19/14   [provider]    Family History Family History  Problem Relation Age of Onset  . Emphysema Father   . Hypertension Mother   . Alzheimer's disease Mother   . Allergy (severe)  Mother   . Lung cancer Brother   . Heart disease Brother   . Heart disease Sister   . COPD Daughter   . Thyroid disease Daughter   . Thyroid disease Daughter     Social History Social History   Tobacco Use  . Smoking status: Passive Smoke Exposure - Never Smoker  . Smokeless tobacco: Never Used  . Tobacco comment: Parents, siblings, and ex-husband smoked around her.  Substance Use Topics  . Alcohol use: Yes    Comment: rare  . Drug use: No     Allergies   Aspirin  Review of Systems Review of Systems  All other systems reviewed and are negative.    Physical Exam Updated Vital Signs BP (!) 133/94 (BP Location: Left Arm)   Pulse 87   Temp 98.4 F (36.9 C) (Oral)   Resp 20   SpO2 96%   Physical Exam Vitals signs and nursing note reviewed.  Constitutional:      General: She is not in acute distress.    Appearance: Normal appearance. She is not ill-appearing.  HENT:     Head: Normocephalic and atraumatic.     Right Ear: External ear normal.     Left Ear: External ear normal.     Nose: Nose normal.     Mouth/Throat:     Mouth: Mucous membranes are moist.  Eyes:     Pupils: Pupils are equal, round, and reactive to light.  Neck:     Musculoskeletal: Normal range of motion.  Cardiovascular:     Rate and Rhythm: Normal rate and regular rhythm.  Pulmonary:     Effort: Pulmonary effort is normal.     Breath sounds: Normal breath sounds.  Abdominal:     General: Abdomen is flat.     Palpations: Abdomen is soft.  Musculoskeletal: Normal range of motion.  Skin:    General: Skin is warm and dry.     Capillary Refill: Capillary refill takes less than 2 seconds.  Neurological:     General: No focal deficit present.     Mental Status: She is alert.  Psychiatric:        Mood and Affect: Mood normal.      ED Treatments / Results  Labs (all labs ordered are listed, but only abnormal results are displayed) Labs Reviewed - No data to display  EKG EKG  Interpretation  Date/Time:  Friday September 20 2018 22:20:27 EDT Ventricular Rate:  88 PR Interval:    QRS Duration: 132 QT Interval:  410 QTC Calculation: 497 R Axis:   49 Text Interpretation:  Sinus rhythm Left bundle branch block No significant change since last tracing Confirmed by Pattricia Boss [phone]) on 09/20/2018 10:57:47 PM   Radiology Dg Chest Port 1 View  Result Date: 09/20/2018 CLINICAL DATA:  83 y/o F; shortness of breath and right-sided chest wall pain with movement. EXAM: PORTABLE CHEST 1 VIEW COMPARISON:  08/05/2018 chest radiograph FINDINGS: Stable mildly enlarged cardiac silhouette given projection and technique. Single lead AICD wire noted. No pacing device. Aortic atherosclerosis with calcification. Clear lungs. No pleural effusion or pneumothorax. Severe osteoarthrosis of the left glenohumeral joint. No acute osseous abnormality identified. IMPRESSION: No acute pulmonary process identified. Electronically Signed   By: Kristine Garbe M.D.   On: 09/20/2018 22:52    Procedures Procedures (including critical care time)  Medications Ordered in ED Medications - No data to display   Initial Impression / Assessment and Plan / ED Course  I have reviewed the triage vital signs and the nursing notes.  Pertinent labs & imaging results that were available during my care of the patient were reviewed by me and considered in my medical decision making (see chart for details).      Patient with bloody, nasal discharge and increased nasal mucous.  She also complains of dyspnea, chest wall pain, vision changes, and poor appetite DDX- Infectious etiology- covid 19 test pending, vs sinusitis ( ct sinus pending)  Dyspnea- infection vs cardiac vs pe- troponin pending, consider ct angio if remainder of work up without source Vision change- ct  brain ? Stroke , may need mri  Chest pain- pain has been present for weeks and sounds musculoskeletal, but with dyspnea may require ct  angio if other work up negative  Discussed with Dr. Wyvonnia Dusky and he will follow up results and reevaluate patient Critical abnormal labs reported with hgb of 4, platelets 3,000- discussed with Dr. Wyvonnia Dusky and patient being typed and crossed with plans to transfuse and obtain heme consult     Final Clinical Impressions(s) / ED Diagnoses   Final diagnoses:  Dyspnea, unspecified type  Nasal and sinus discharge    ED Discharge Orders    None       Pattricia Boss, MD 09/20/18 2325    Pattricia Boss, MD 09/20/18 2340

## 2018-09-20 NOTE — ED Notes (Signed)
Bed: OV78 Expected date:  Expected time:  Means of arrival:  Comments: F congestion and headache

## 2018-09-20 NOTE — ED Triage Notes (Signed)
Pt comes to ed via ems, pt has a been diagnose on Tuesday with sinus infection but has not been able to get to the drug store. V/s 110/64, hr 80,cbg 188  rr18, spo2 96 room air. Complains of SOB and right side chest wall pain on movement.

## 2018-09-20 NOTE — ED Notes (Signed)
Date and time results received: 09/20/18 2332  Test: platelets Critical Value: 3,000  Name of Provider Notified: Rancour, MD  Orders Received? Or Actions Taken?:

## 2018-09-20 NOTE — ED Provider Notes (Signed)
Care assumed from Dr. Jeanell Sparrow at 2:90 PM.  83 year old female with history of diastolic CHF, aortic insufficiency presenting with shortness of breath, dyspnea with exertion and bloody drainage from her sinuses.  Recently treated for sinusitis by her PCP but did not fill antibiotics.  Progressively worsening dyspnea over the past several weeks to months.  No chest pain.  She is awaiting labs and CT imaging of her head and sinuses.  Patient appears pale and is dyspneic with conversation but has clear lungs and no hypoxia.  She has multiple areas of petechial bruising.  Labs show profound anemia and thrombocytopenia.  Patient states her stools have been dark recently.  She is on aspirin but no other anticoagulation.  She is agreeable to blood and platelet transfusion.  Will discuss with hematology.  Discussed with Dr. Maylon Peppers of hematology.  He states to add peripheral smear and LDH.  Agrees there is some concern for ITP.  Lower suspicion for TTP with normal renal function and normal bilirubin. Recommends dexamethasone 40 mg p.o. x1.  CT PE is pending.  Peripheral smear pending.  Discussed results with patient's daughter Arby Barrette by her request. [phone]  Patient remains hemodynamically stable in the ED.  Admission discussed with Dr. Si Raider hospitalist service.  CT pulmonary embolism study pending at time of admission but feel her dyspnea is likely secondary to her profound anemia  Dr. Maylon Peppers came in to evaluate patient and her peripheral smear.  He is concerned that there are blasts cells present and he is worried about a lymphoproliferative disorder.  He request to hold admission at this point as patient may need to be transferred to [facility].  Dr. Maylon Peppers has seen patient discussed with [facility] hematology.  The plan is for patient to stay at [facility] for possible transfer in the morning if necessary.  He also requested steroids to be canceled as he was worried about suppressing  any presence of blasts to make diagnosis more difficult.  He agrees with transfusion of RBCs and platelets.  Admission discussed with Dr. Blaine Hamper.  CRITICAL CARE Performed by: Ezequiel Essex Total critical care time: 60 minutes Critical care time was exclusive of separately billable procedures and treating other patients. Critical care was necessary to treat or prevent imminent or life-threatening deterioration. Critical care was time spent personally by me on the following activities: development of treatment plan with patient and/or surrogate as well as nursing, discussions with consultants, evaluation of patient's response to treatment, examination of patient, obtaining history from patient or surrogate, ordering and performing treatments and interventions, ordering and review of laboratory studies, ordering and review of radiographic studies, pulse oximetry and re-evaluation of patient's condition.    Ezequiel Essex, MD 09/21/18 [phone]

## 2018-09-20 NOTE — ED Notes (Signed)
Date and time results received: 09/20/18 2332 (use smartphrase ".now" to insert current time)  Test: hgb Critical Value: 4.0  Name of Provider Notified: Rancour, MD   Orders Received? Or Actions Taken?:

## 2018-09-21 ENCOUNTER — Encounter (HOSPITAL_COMMUNITY): Payer: Self-pay | Admitting: *Deleted

## 2018-09-21 ENCOUNTER — Emergency Department (HOSPITAL_COMMUNITY): Payer: Medicare Other

## 2018-09-21 ENCOUNTER — Inpatient Hospital Stay (HOSPITAL_COMMUNITY): Payer: Medicare Other

## 2018-09-21 ENCOUNTER — Other Ambulatory Visit: Payer: Self-pay

## 2018-09-21 DIAGNOSIS — K219 Gastro-esophageal reflux disease without esophagitis: Secondary | ICD-10-CM | POA: Diagnosis present

## 2018-09-21 DIAGNOSIS — I272 Pulmonary hypertension, unspecified: Secondary | ICD-10-CM | POA: Diagnosis not present

## 2018-09-21 DIAGNOSIS — Z7982 Long term (current) use of aspirin: Secondary | ICD-10-CM | POA: Diagnosis not present

## 2018-09-21 DIAGNOSIS — C9101 Acute lymphoblastic leukemia, in remission: Secondary | ICD-10-CM | POA: Diagnosis not present

## 2018-09-21 DIAGNOSIS — J329 Chronic sinusitis, unspecified: Secondary | ICD-10-CM | POA: Diagnosis present

## 2018-09-21 DIAGNOSIS — D696 Thrombocytopenia, unspecified: Secondary | ICD-10-CM | POA: Diagnosis not present

## 2018-09-21 DIAGNOSIS — I420 Dilated cardiomyopathy: Secondary | ICD-10-CM | POA: Diagnosis present

## 2018-09-21 DIAGNOSIS — Z5111 Encounter for antineoplastic chemotherapy: Secondary | ICD-10-CM | POA: Diagnosis not present

## 2018-09-21 DIAGNOSIS — Z9842 Cataract extraction status, left eye: Secondary | ICD-10-CM | POA: Diagnosis not present

## 2018-09-21 DIAGNOSIS — H4310 Vitreous hemorrhage, unspecified eye: Secondary | ICD-10-CM | POA: Diagnosis not present

## 2018-09-21 DIAGNOSIS — Z20828 Contact with and (suspected) exposure to other viral communicable diseases: Secondary | ICD-10-CM | POA: Diagnosis present

## 2018-09-21 DIAGNOSIS — I10 Essential (primary) hypertension: Secondary | ICD-10-CM

## 2018-09-21 DIAGNOSIS — R630 Anorexia: Secondary | ICD-10-CM | POA: Diagnosis present

## 2018-09-21 DIAGNOSIS — K5190 Ulcerative colitis, unspecified, without complications: Secondary | ICD-10-CM | POA: Diagnosis not present

## 2018-09-21 DIAGNOSIS — E8770 Fluid overload, unspecified: Secondary | ICD-10-CM | POA: Diagnosis not present

## 2018-09-21 DIAGNOSIS — K51911 Ulcerative colitis, unspecified with rectal bleeding: Secondary | ICD-10-CM | POA: Diagnosis present

## 2018-09-21 DIAGNOSIS — R0600 Dyspnea, unspecified: Secondary | ICD-10-CM | POA: Diagnosis not present

## 2018-09-21 DIAGNOSIS — Z743 Need for continuous supervision: Secondary | ICD-10-CM | POA: Diagnosis not present

## 2018-09-21 DIAGNOSIS — R110 Nausea: Secondary | ICD-10-CM | POA: Diagnosis not present

## 2018-09-21 DIAGNOSIS — I083 Combined rheumatic disorders of mitral, aortic and tricuspid valves: Secondary | ICD-10-CM | POA: Diagnosis not present

## 2018-09-21 DIAGNOSIS — R531 Weakness: Secondary | ICD-10-CM | POA: Diagnosis present

## 2018-09-21 DIAGNOSIS — Z03818 Encounter for observation for suspected exposure to other biological agents ruled out: Secondary | ICD-10-CM | POA: Diagnosis not present

## 2018-09-21 DIAGNOSIS — N3000 Acute cystitis without hematuria: Secondary | ICD-10-CM

## 2018-09-21 DIAGNOSIS — Z9841 Cataract extraction status, right eye: Secondary | ICD-10-CM | POA: Diagnosis not present

## 2018-09-21 DIAGNOSIS — Z90710 Acquired absence of both cervix and uterus: Secondary | ICD-10-CM | POA: Diagnosis not present

## 2018-09-21 DIAGNOSIS — D709 Neutropenia, unspecified: Secondary | ICD-10-CM | POA: Diagnosis not present

## 2018-09-21 DIAGNOSIS — D649 Anemia, unspecified: Secondary | ICD-10-CM | POA: Diagnosis not present

## 2018-09-21 DIAGNOSIS — R5381 Other malaise: Secondary | ICD-10-CM | POA: Diagnosis not present

## 2018-09-21 DIAGNOSIS — R509 Fever, unspecified: Secondary | ICD-10-CM | POA: Diagnosis not present

## 2018-09-21 DIAGNOSIS — H4312 Vitreous hemorrhage, left eye: Secondary | ICD-10-CM | POA: Diagnosis present

## 2018-09-21 DIAGNOSIS — C91Z0 Other lymphoid leukemia not having achieved remission: Secondary | ICD-10-CM | POA: Diagnosis not present

## 2018-09-21 DIAGNOSIS — K922 Gastrointestinal hemorrhage, unspecified: Secondary | ICD-10-CM

## 2018-09-21 DIAGNOSIS — Z1159 Encounter for screening for other viral diseases: Secondary | ICD-10-CM | POA: Diagnosis not present

## 2018-09-21 DIAGNOSIS — I447 Left bundle-branch block, unspecified: Secondary | ICD-10-CM | POA: Diagnosis not present

## 2018-09-21 DIAGNOSIS — Z801 Family history of malignant neoplasm of trachea, bronchus and lung: Secondary | ICD-10-CM | POA: Diagnosis not present

## 2018-09-21 DIAGNOSIS — E039 Hypothyroidism, unspecified: Secondary | ICD-10-CM | POA: Diagnosis present

## 2018-09-21 DIAGNOSIS — Z8719 Personal history of other diseases of the digestive system: Secondary | ICD-10-CM | POA: Diagnosis not present

## 2018-09-21 DIAGNOSIS — Z85850 Personal history of malignant neoplasm of thyroid: Secondary | ICD-10-CM

## 2018-09-21 DIAGNOSIS — T451X5A Adverse effect of antineoplastic and immunosuppressive drugs, initial encounter: Secondary | ICD-10-CM | POA: Diagnosis not present

## 2018-09-21 DIAGNOSIS — E8779 Other fluid overload: Secondary | ICD-10-CM | POA: Diagnosis not present

## 2018-09-21 DIAGNOSIS — H04123 Dry eye syndrome of bilateral lacrimal glands: Secondary | ICD-10-CM | POA: Diagnosis not present

## 2018-09-21 DIAGNOSIS — I5020 Unspecified systolic (congestive) heart failure: Secondary | ICD-10-CM | POA: Diagnosis not present

## 2018-09-21 DIAGNOSIS — R7989 Other specified abnormal findings of blood chemistry: Secondary | ICD-10-CM | POA: Diagnosis not present

## 2018-09-21 DIAGNOSIS — E119 Type 2 diabetes mellitus without complications: Secondary | ICD-10-CM | POA: Diagnosis not present

## 2018-09-21 DIAGNOSIS — R740 Nonspecific elevation of levels of transaminase and lactic acid dehydrogenase [LDH]: Secondary | ICD-10-CM | POA: Diagnosis present

## 2018-09-21 DIAGNOSIS — J3489 Other specified disorders of nose and nasal sinuses: Secondary | ICD-10-CM | POA: Insufficient documentation

## 2018-09-21 DIAGNOSIS — B9620 Unspecified Escherichia coli [E. coli] as the cause of diseases classified elsewhere: Secondary | ICD-10-CM | POA: Diagnosis not present

## 2018-09-21 DIAGNOSIS — R0602 Shortness of breath: Secondary | ICD-10-CM | POA: Diagnosis not present

## 2018-09-21 DIAGNOSIS — Z79899 Other long term (current) drug therapy: Secondary | ICD-10-CM | POA: Diagnosis not present

## 2018-09-21 DIAGNOSIS — Z90722 Acquired absence of ovaries, bilateral: Secondary | ICD-10-CM | POA: Diagnosis not present

## 2018-09-21 DIAGNOSIS — C9100 Acute lymphoblastic leukemia not having achieved remission: Secondary | ICD-10-CM | POA: Diagnosis present

## 2018-09-21 DIAGNOSIS — D72819 Decreased white blood cell count, unspecified: Secondary | ICD-10-CM | POA: Insufficient documentation

## 2018-09-21 DIAGNOSIS — Z95810 Presence of automatic (implantable) cardiac defibrillator: Secondary | ICD-10-CM | POA: Diagnosis not present

## 2018-09-21 DIAGNOSIS — Z79890 Hormone replacement therapy (postmenopausal): Secondary | ICD-10-CM | POA: Diagnosis not present

## 2018-09-21 DIAGNOSIS — M170 Bilateral primary osteoarthritis of knee: Secondary | ICD-10-CM | POA: Diagnosis not present

## 2018-09-21 DIAGNOSIS — R040 Epistaxis: Secondary | ICD-10-CM | POA: Diagnosis not present

## 2018-09-21 DIAGNOSIS — H36 Retinal disorders in diseases classified elsewhere: Secondary | ICD-10-CM | POA: Diagnosis present

## 2018-09-21 DIAGNOSIS — F329 Major depressive disorder, single episode, unspecified: Secondary | ICD-10-CM | POA: Diagnosis not present

## 2018-09-21 DIAGNOSIS — I5030 Unspecified diastolic (congestive) heart failure: Secondary | ICD-10-CM | POA: Diagnosis not present

## 2018-09-21 DIAGNOSIS — R6889 Other general symptoms and signs: Secondary | ICD-10-CM | POA: Diagnosis present

## 2018-09-21 DIAGNOSIS — I454 Nonspecific intraventricular block: Secondary | ICD-10-CM | POA: Diagnosis not present

## 2018-09-21 DIAGNOSIS — E785 Hyperlipidemia, unspecified: Secondary | ICD-10-CM

## 2018-09-21 DIAGNOSIS — J4540 Moderate persistent asthma, uncomplicated: Secondary | ICD-10-CM | POA: Diagnosis present

## 2018-09-21 DIAGNOSIS — J4530 Mild persistent asthma, uncomplicated: Secondary | ICD-10-CM | POA: Diagnosis not present

## 2018-09-21 DIAGNOSIS — R911 Solitary pulmonary nodule: Secondary | ICD-10-CM | POA: Diagnosis not present

## 2018-09-21 DIAGNOSIS — D688 Other specified coagulation defects: Secondary | ICD-10-CM | POA: Diagnosis not present

## 2018-09-21 DIAGNOSIS — I5032 Chronic diastolic (congestive) heart failure: Secondary | ICD-10-CM | POA: Diagnosis present

## 2018-09-21 DIAGNOSIS — R1030 Lower abdominal pain, unspecified: Secondary | ICD-10-CM | POA: Diagnosis not present

## 2018-09-21 DIAGNOSIS — N3281 Overactive bladder: Secondary | ICD-10-CM | POA: Diagnosis present

## 2018-09-21 DIAGNOSIS — R197 Diarrhea, unspecified: Secondary | ICD-10-CM | POA: Diagnosis not present

## 2018-09-21 DIAGNOSIS — C9200 Acute myeloblastic leukemia, not having achieved remission: Secondary | ICD-10-CM | POA: Diagnosis not present

## 2018-09-21 DIAGNOSIS — A040 Enteropathogenic Escherichia coli infection: Secondary | ICD-10-CM | POA: Diagnosis not present

## 2018-09-21 DIAGNOSIS — I5043 Acute on chronic combined systolic (congestive) and diastolic (congestive) heart failure: Secondary | ICD-10-CM | POA: Diagnosis present

## 2018-09-21 DIAGNOSIS — K5100 Ulcerative (chronic) pancolitis without complications: Secondary | ICD-10-CM | POA: Diagnosis not present

## 2018-09-21 DIAGNOSIS — D689 Coagulation defect, unspecified: Secondary | ICD-10-CM | POA: Diagnosis not present

## 2018-09-21 DIAGNOSIS — D61818 Other pancytopenia: Principal | ICD-10-CM

## 2018-09-21 DIAGNOSIS — Z7722 Contact with and (suspected) exposure to environmental tobacco smoke (acute) (chronic): Secondary | ICD-10-CM

## 2018-09-21 DIAGNOSIS — I110 Hypertensive heart disease with heart failure: Secondary | ICD-10-CM | POA: Diagnosis present

## 2018-09-21 DIAGNOSIS — E890 Postprocedural hypothyroidism: Secondary | ICD-10-CM | POA: Diagnosis present

## 2018-09-21 DIAGNOSIS — D65 Disseminated intravascular coagulation [defibrination syndrome]: Secondary | ICD-10-CM | POA: Diagnosis not present

## 2018-09-21 DIAGNOSIS — D61810 Antineoplastic chemotherapy induced pancytopenia: Secondary | ICD-10-CM | POA: Diagnosis not present

## 2018-09-21 DIAGNOSIS — K51919 Ulcerative colitis, unspecified with unspecified complications: Secondary | ICD-10-CM | POA: Diagnosis not present

## 2018-09-21 DIAGNOSIS — N390 Urinary tract infection, site not specified: Secondary | ICD-10-CM | POA: Diagnosis present

## 2018-09-21 DIAGNOSIS — Z825 Family history of asthma and other chronic lower respiratory diseases: Secondary | ICD-10-CM | POA: Diagnosis not present

## 2018-09-21 DIAGNOSIS — R279 Unspecified lack of coordination: Secondary | ICD-10-CM | POA: Diagnosis not present

## 2018-09-21 LAB — URINALYSIS, ROUTINE W REFLEX MICROSCOPIC
Bilirubin Urine: NEGATIVE
Glucose, UA: NEGATIVE mg/dL
Ketones, ur: NEGATIVE mg/dL
Leukocytes,Ua: NEGATIVE
Nitrite: POSITIVE — AB
Protein, ur: NEGATIVE mg/dL
Specific Gravity, Urine: 1.015 (ref 1.005–1.030)
pH: 6 (ref 5.0–8.0)

## 2018-09-21 LAB — FOLATE: Folate: 22 ng/mL (ref >5.9)

## 2018-09-21 LAB — PROTIME-INR
INR: 1.3 — ABNORMAL HIGH (ref 0.8–1.2)
Prothrombin Time: 15.9 seconds — ABNORMAL HIGH (ref 11.4–15.2)

## 2018-09-21 LAB — IRON AND TIBC
Iron: 184 ug/dL — ABNORMAL HIGH (ref 28–170)
Saturation Ratios: 62 % — ABNORMAL HIGH (ref 10.4–31.8)
TIBC: 299 ug/dL (ref 250–450)
UIBC: 115 ug/dL

## 2018-09-21 LAB — BASIC METABOLIC PANEL
Anion gap: 8 (ref 5–15)
BUN: 26 mg/dL — ABNORMAL HIGH (ref 8–23)
CO2: 21 mmol/L — ABNORMAL LOW (ref 22–32)
Calcium: 8.4 mg/dL — ABNORMAL LOW (ref 8.9–10.3)
Chloride: 107 mmol/L (ref 98–111)
Creatinine, Ser: 0.9 mg/dL (ref 0.44–1.00)
GFR calc Af Amer: >60 mL/min (ref >60)
GFR calc non Af Amer: >60 mL/min (ref >60)
Glucose, Bld: 120 mg/dL — ABNORMAL HIGH (ref 70–99)
Potassium: 4.3 mmol/L (ref 3.5–5.1)
Sodium: 136 mmol/L (ref 135–145)

## 2018-09-21 LAB — CBC
HCT: 18.7 % — ABNORMAL LOW (ref 36.0–46.0)
Hemoglobin: 6.2 g/dL — CL (ref 12.0–15.0)
MCH: 30.2 pg (ref 26.0–34.0)
MCHC: 33.2 g/dL (ref 30.0–36.0)
MCV: 91.2 fL (ref 80.0–100.0)
Platelets: 53 10*3/uL — ABNORMAL LOW (ref 150–400)
RBC: 2.05 MIL/uL — ABNORMAL LOW (ref 3.87–5.11)
RDW: 17.7 % — ABNORMAL HIGH (ref 11.5–15.5)
WBC: 2.5 10*3/uL — ABNORMAL LOW (ref 4.0–10.5)
nRBC: 3.6 % — ABNORMAL HIGH (ref 0.0–0.2)

## 2018-09-21 LAB — LACTIC ACID, PLASMA: Lactic Acid, Venous: 1.4 mmol/L (ref 0.5–1.9)

## 2018-09-21 LAB — TROPONIN I: Troponin I: <0.03 ng/mL (ref <0.03)

## 2018-09-21 LAB — GLUCOSE, CAPILLARY
Glucose-Capillary: 106 mg/dL — ABNORMAL HIGH (ref 70–99)
Glucose-Capillary: 132 mg/dL — ABNORMAL HIGH (ref 70–99)

## 2018-09-21 LAB — APTT: aPTT: 34 seconds (ref 24–36)

## 2018-09-21 LAB — VITAMIN B12: Vitamin B-12: 784 pg/mL (ref 180–914)

## 2018-09-21 LAB — FERRITIN: Ferritin: 625 ng/mL — ABNORMAL HIGH (ref 11–307)

## 2018-09-21 LAB — POC OCCULT BLOOD, ED: Fecal Occult Bld: POSITIVE — AB

## 2018-09-21 LAB — ABO/RH: ABO/RH(D): B POS

## 2018-09-21 LAB — URINALYSIS, MICROSCOPIC (REFLEX)

## 2018-09-21 LAB — URIC ACID: Uric Acid, Serum: 5.6 mg/dL (ref 2.5–7.1)

## 2018-09-21 LAB — PREPARE RBC (CROSSMATCH)

## 2018-09-21 LAB — PROCALCITONIN: Procalcitonin: 0.1 ng/mL

## 2018-09-21 MED ORDER — NUTREN 1.0 PO
1.00 | ORAL | Status: DC
Start: ? — End: 2018-09-21

## 2018-09-21 MED ORDER — MESALAMINE 1.2 G PO TBEC
1.2000 g | DELAYED_RELEASE_TABLET | Freq: Every day | ORAL | Status: DC
  Administered 2018-09-21: 1.2 g via ORAL
  Filled 2018-09-21: qty 1

## 2018-09-21 MED ORDER — SODIUM CHLORIDE (PF) 0.9 % IJ SOLN
INTRAMUSCULAR | Status: AC
  Filled 2018-09-21: qty 50

## 2018-09-21 MED ORDER — ACETAMINOPHEN 650 MG RE SUPP: 650.0000 mg | Freq: Four times a day (QID) | RECTAL | Status: DC | PRN

## 2018-09-21 MED ORDER — LEVOFLOXACIN 500 MG PO TABS
500.0000 mg | ORAL_TABLET | Freq: Every day | ORAL | Status: DC
  Administered 2018-09-21: 10:00:00 500 mg via ORAL
  Filled 2018-09-21: qty 1

## 2018-09-21 MED ORDER — PANTOPRAZOLE SODIUM 40 MG PO TBEC: 40.0000 mg | DELAYED_RELEASE_TABLET | Freq: Two times a day (BID) | ORAL | Status: DC

## 2018-09-21 MED ORDER — SODIUM CHLORIDE 0.9 % IV SOLN
1.0000 g | Freq: Every day | INTRAVENOUS | Status: DC
  Administered 2018-09-21: 06:00:00 1 g via INTRAVENOUS
  Filled 2018-09-21: qty 1

## 2018-09-21 MED ORDER — MONTELUKAST SODIUM 10 MG PO TABS
10.0000 mg | ORAL_TABLET | Freq: Every day | ORAL | Status: DC
  Administered 2018-09-21: 10 mg via ORAL
  Filled 2018-09-21: qty 1

## 2018-09-21 MED ORDER — ONDANSETRON HCL 4 MG/2ML IJ SOLN: 4.0000 mg | Freq: Four times a day (QID) | INTRAMUSCULAR | Status: DC | PRN

## 2018-09-21 MED ORDER — OXYBUTYNIN CHLORIDE ER 5 MG PO TB24
5.0000 mg | ORAL_TABLET | Freq: Every day | ORAL | Status: DC
  Administered 2018-09-21: 5 mg via ORAL
  Filled 2018-09-21: qty 1

## 2018-09-21 MED ORDER — LEVOTHYROXINE SODIUM 112 MCG PO TABS
112.00 | ORAL_TABLET | ORAL | Status: DC
Start: 2018-09-22 — End: 2018-09-21

## 2018-09-21 MED ORDER — IOHEXOL 350 MG/ML SOLN
100.0000 mL | Freq: Once | INTRAVENOUS | Status: AC | PRN
  Administered 2018-09-21: 100 mL via INTRAVENOUS

## 2018-09-21 MED ORDER — SODIUM CHLORIDE 0.9 % IV SOLN: 1.0000 g | Freq: Every day | INTRAVENOUS | Status: DC

## 2018-09-21 MED ORDER — MELATONIN 3 MG PO TABS
3.00 | ORAL_TABLET | ORAL | Status: DC
Start: ? — End: 2018-09-21

## 2018-09-21 MED ORDER — FLUCONAZOLE 100 MG PO TABS
100.0000 mg | ORAL_TABLET | Freq: Every day | ORAL | Status: DC
  Administered 2018-09-21: 06:00:00 100 mg via ORAL
  Filled 2018-09-21 (×3): qty 1

## 2018-09-21 MED ORDER — HYDRALAZINE HCL 20 MG/ML IJ SOLN: 5.0000 mg | INTRAMUSCULAR | Status: DC | PRN

## 2018-09-21 MED ORDER — MOXIFLOXACIN HCL 400 MG PO TABS
400.00 | ORAL_TABLET | ORAL | Status: DC
Start: 2018-09-25 — End: 2018-09-21

## 2018-09-21 MED ORDER — TEMAZEPAM 15 MG PO CAPS: 15.0000 mg | ORAL_CAPSULE | Freq: Every evening | ORAL | Status: DC | PRN

## 2018-09-21 MED ORDER — DOBUTAMINE IN D5W IV
300.00 | INTRAVENOUS | Status: DC
Start: 2018-09-25 — End: 2018-09-21

## 2018-09-21 MED ORDER — FLUCONAZOLE 200 MG PO TABS
200.00 | ORAL_TABLET | ORAL | Status: DC
Start: 2018-09-25 — End: 2018-09-21

## 2018-09-21 MED ORDER — ROSUVASTATIN CALCIUM 20 MG PO TABS
20.00 | ORAL_TABLET | ORAL | Status: DC
Start: 2018-10-15 — End: 2018-09-21

## 2018-09-21 MED ORDER — PANTOPRAZOLE SODIUM 40 MG IV SOLR
40.0000 mg | Freq: Two times a day (BID) | INTRAVENOUS | Status: DC
  Administered 2018-09-21 (×2): 40 mg via INTRAVENOUS
  Filled 2018-09-21 (×2): qty 40

## 2018-09-21 MED ORDER — SODIUM CHLORIDE 0.9 % IV SOLN
INTRAVENOUS | Status: DC
Start: ? — End: 2018-09-21

## 2018-09-21 MED ORDER — SODIUM CHLORIDE 0.9% IV SOLUTION: Freq: Once | INTRAVENOUS | Status: DC

## 2018-09-21 MED ORDER — TRAMADOL HCL 50 MG PO TABS
50.00 | ORAL_TABLET | ORAL | Status: DC
Start: ? — End: 2018-09-21

## 2018-09-21 MED ORDER — CARVEDILOL 12.5 MG PO TABS
25.0000 mg | ORAL_TABLET | Freq: Two times a day (BID) | ORAL | Status: DC
  Administered 2018-09-21: 08:00:00 25 mg via ORAL
  Filled 2018-09-21: qty 2

## 2018-09-21 MED ORDER — LIP MEDEX EX OINT
TOPICAL_OINTMENT | CUTANEOUS | Status: AC
  Administered 2018-09-21: 1
  Filled 2018-09-21: qty 7

## 2018-09-21 MED ORDER — SODIUM CHLORIDE 0.9 % IV BOLUS
500.0000 mL | Freq: Once | INTRAVENOUS | Status: AC
  Administered 2018-09-21: 03:00:00 500 mL via INTRAVENOUS

## 2018-09-21 MED ORDER — SALINE SPRAY 0.65 % NA SOLN
1.0000 | NASAL | Status: DC | PRN
  Filled 2018-09-21: qty 44

## 2018-09-21 MED ORDER — FLUCONAZOLE 100 MG PO TABS: 100.0000 mg | ORAL_TABLET | Freq: Every day | ORAL | Status: DC

## 2018-09-21 MED ORDER — IPRATROPIUM-ALBUTEROL 0.5-2.5 (3) MG/3ML IN SOLN
3.00 | RESPIRATORY_TRACT | Status: DC
Start: ? — End: 2018-09-21

## 2018-09-21 MED ORDER — SODIUM CHLORIDE 0.9 % IV SOLN
INTRAVENOUS | Status: DC | PRN
  Administered 2018-09-21: 10:00:00 250 mL via INTRAVENOUS

## 2018-09-21 MED ORDER — DULOXETINE HCL 30 MG PO CPEP
30.00 | ORAL_CAPSULE | ORAL | Status: DC
Start: 2018-10-16 — End: 2018-09-21

## 2018-09-21 MED ORDER — ONDANSETRON HCL 4 MG PO TABS: 4.0000 mg | ORAL_TABLET | Freq: Four times a day (QID) | ORAL | Status: DC | PRN

## 2018-09-21 MED ORDER — ROSUVASTATIN CALCIUM 10 MG PO TABS: 5.0000 mg | ORAL_TABLET | Freq: Every day | ORAL | Status: DC

## 2018-09-21 MED ORDER — ACYCLOVIR 200 MG PO CAPS
400.0000 mg | ORAL_CAPSULE | Freq: Two times a day (BID) | ORAL | Status: DC
  Administered 2018-09-21: 06:00:00 400 mg via ORAL
  Filled 2018-09-21 (×2): qty 2

## 2018-09-21 MED ORDER — MESALAMINE ER 250 MG PO CPCR
1000.00 | ORAL_CAPSULE | ORAL | Status: DC
Start: 2018-10-16 — End: 2018-09-21

## 2018-09-21 MED ORDER — ACETAMINOPHEN 325 MG PO TABS: 650.0000 mg | ORAL_TABLET | Freq: Four times a day (QID) | ORAL | Status: DC | PRN

## 2018-09-21 MED ORDER — ACYCLOVIR 400 MG PO TABS
400.00 | ORAL_TABLET | ORAL | Status: DC
Start: 2018-09-25 — End: 2018-09-21

## 2018-09-21 MED ORDER — LEVOTHYROXINE SODIUM 100 MCG PO TABS
100.0000 ug | ORAL_TABLET | Freq: Every day | ORAL | Status: DC
  Administered 2018-09-21: 100 ug via ORAL
  Filled 2018-09-21: qty 1

## 2018-09-21 MED ORDER — DEXAMETHASONE 4 MG PO TABS: 40.0000 mg | ORAL_TABLET | Freq: Once | ORAL | Status: DC

## 2018-09-21 MED ORDER — CARVEDILOL 12.5 MG PO TABS
12.50 | ORAL_TABLET | ORAL | Status: DC
Start: 2018-09-23 — End: 2018-09-21

## 2018-09-21 MED ORDER — PANTOPRAZOLE SODIUM 40 MG PO TBEC
40.00 | DELAYED_RELEASE_TABLET | ORAL | Status: DC
Start: 2018-09-24 — End: 2018-09-21

## 2018-09-21 MED ORDER — ACYCLOVIR 200 MG PO CAPS: 400.0000 mg | ORAL_CAPSULE | Freq: Two times a day (BID) | ORAL | Status: DC

## 2018-09-21 NOTE — ED Notes (Signed)
Consult to Hospitalist @02 :28am.

## 2018-09-21 NOTE — Progress Notes (Signed)
Pt tx from  ED alert x4, no c/o pain, is currently on second unit of blood, we are awaiting Platelets from [facility]. Admission skin assessment with [facility] RN. I will continue to monitor.

## 2018-09-21 NOTE — Progress Notes (Signed)
Per patient request updated  patients daughter Mardella Layman ([phone]) of her current  Condition and that she was transferring to [facility].

## 2018-09-21 NOTE — Discharge Summary (Signed)
Physician Discharge Summary  [name] DVV:[id number] DOB: 1935-06-16 DOA: 09/20/2018  PCP: Rolene Course, PA-C  Admit date: 09/20/2018 Discharge date: 09/21/2018  Admitted From: Home Disposition: Transfer to [facility], accepting physician Dr. Phill Myron, oncology   Discharge Condition: stable CODE STATUS: Full code Diet recommendation: Heart Healthy  History of present illness:  Pt states that she has been generalized weakness for more than 2 months.  She also has shortness of breath on exertion. Her symptoms have been progressively worsening.  She does not have chest pain, cough, fever.  She sometimes has chills.  Patient states that she has sinus issue, with bloody nasal drainage with bright red blood sometimes. Pt was seen by PCP and given prescription of antibiotics, but she did not get refill.  She states that sometimes she noticed dark stool, denies nausea, vomiting or diarrhea.  Occasionally she has right-sided mild abdominal pain, currently no abdominal pain.  She has increased urinary frequency, but no dysuria or burning on urination.  No unilateral numbness or tingling to extremities.  No facial droop or slurred speech. No unexplained weight loss.  Patient was initially suspected to have COVID-19, but test is negative for COVID-19 in ED.   ED Course: pt was found to have severe anemia (Hgb 4.0 w/ MCV 99)<--hgb 14.5 on 12/04/16, thrombocytopenia (plts 3k) and WBC of 3.7k (PMN 7%, lymphocytes 77%). LDH 515k. Cr and LFT's normal, positive urinalysis (hazy appearance, positive nitrite, many bacteria, WBC 6-10), d-dimer 2.59, CRP 9.3, fibrinogen 568, triglyceride 108.  Patient is admitted to stepdown as inpatient.  CT head is negative for acute intracranial abnormalities. Hematologist, Dr. Maylon Peppers was consulted by EDP.   Hospital course:  Pancytopenia Symptomatic anemia Patient presenting with progressive weakness and shortness of breath over the past 2  months.  Was noted to have a hemoglobin of 4.0, WBC count 3.7, platelets 3 on admission.  Further work-up notable for BNP 167, elevated LDH 515, ferritin elevated 625, normal iron/TIBC/folate/B12.  D-dimer elevated 2.59 with fibrinogen elevated 568.  COVID-19 negative.  Lactic acid 2.2 and procalcitonin 0.10.  Chest x-ray unremarkable.  CTA PE negative for pulmonary embolism but with slightly increased right lower lobe nodule up to 15 mm. Medical oncology, Dr. Maylon Peppers was consulted and followed during hospital course.  Review of peripheral smear concerning for blasts per oncology.  Case was discussed with oncology at [facility] given concerns of possible hypoproliferative leukemia, Dr. Lissa Merlin graciously accepted for transfer.  Patient received 2 units PRBCs and currently infusing second platelet unit.  Recommend repeat CBC with differential when arrives at [facility].  Acute cystitis without hematuria Urinalysis notable for positive nitrite, negative leukoesterase with 6-10 WBCs.  Patient was started on ceftriaxone.  Urine culture/blood cultures pending at time of transfer to [facility].  Essential hypertension On amlodipine 2.5 mg p.o. daily, Coreg 25 mg p.o. twice daily, ramipril 10 mg p.o. twice daily at home.  Patient was continued on her home Coreg but her amlodipine and ramipril were held due to concerns of hypotension given significant anemia.  Concern for GI bleed Patient with pancytopenia with a hemoglobin of 4.0.  FOBT positive on admission.  Etiology of her significant pancytopenia likely related to possible hypoproliferative leukemia.  Patient with reason to have positive FOBT with history of ulcerative colitis as well as significant thrombocytopenia.  No significant rectal bleeding or hematemesis reported.  Hemodynamically stable.  Diffuse 2 units PRBCs and 2 packed  platelets.  Low concern for GI bleeding given no hematemesis or gross blood in stool.   Continue PPI.  GERD: Continue PPI  Ulcerative colitis: Continue mesalamine  Dyslipidemia: Continue statin  Hypothyroidism: Continue Synthroid  Chronic diastolic congestive heart failure  Not in acute exacerbation.  Reported history of dilated cardiomyopathy in 2006, thought to be secondary to a viral infection.  Initially had an ICD placed but since her cardiac function has normalized an ICD was removed.  Most recent LVEF 60-65% in 2018.   Discharge Diagnoses:  Principal Problem:   Pancytopenia ([address]) Active Problems:   Essential hypertension   GERD (gastroesophageal reflux disease)   Dyslipidemia   Ulcerative colitis (HCC)   Symptomatic anemia   GI bleed   Lung nodule   Hypothyroidism   Chronic diastolic CHF (congestive heart failure) (HCC)   UTI (urinary tract infection)   Sinusitis    Discharge Instructions  Discharge Instructions    Diet - low sodium heart healthy   Complete by:  As directed    Increase activity slowly   Complete by:  As directed      Allergies as of 09/21/2018      Reactions   Aspirin Swelling   CAN TOLERATE 81MG      Medication List    STOP taking these medications   amLODipine 2.5 MG tablet Commonly known as:  NORVASC   aspirin 81 MG tablet   ramipril 10 MG capsule Commonly known as:  ALTACE     TAKE these medications   acetaminophen 500 MG tablet Commonly known as:  TYLENOL Take 1,000 mg by mouth every 6 (six) hours as needed for moderate pain.   acyclovir 200 MG capsule Commonly known as:  ZOVIRAX Take 2 capsules (400 mg total) by mouth 2 (two) times daily.   carvedilol 25 MG tablet Commonly known as:  COREG TAKE 1 TABLET TWICE A DAY WITH MEALS   cefTRIAXone 1 g in sodium chloride 0.9 % 100 mL Inject 1 g into the vein daily at 6 (six) AM. Start taking on:  September 22, 2018   fish oil-omega-3 fatty acids 1000 MG capsule Take 2 g by mouth daily.   fluconazole 100 MG tablet Commonly known as:  DIFLUCAN Take 1 tablet  (100 mg total) by mouth daily. Start taking on:  September 22, 2018   mesalamine 1.2 g EC tablet Commonly known as:  LIALDA Take 1.2 g by mouth daily.   montelukast 10 MG tablet Commonly known as:  SINGULAIR Take 1 tablet (10 mg total) by mouth at bedtime. What changed:  when to take this   multivitamin capsule Take 1 capsule by mouth daily.   omeprazole 40 MG capsule Commonly known as:  PRILOSEC Take 40 mg by mouth daily.   oxybutynin 5 MG 24 hr tablet Commonly known as:  DITROPAN-XL Take 5 mg by mouth daily.   rosuvastatin 5 MG tablet Commonly known as:  CRESTOR TAKE 1 TABLET DAILY   Spacer/Aero Chamber Mouthpiece Misc 1 Device by Does not apply route as directed.   Synthroid 100 MCG tablet Generic drug:  levothyroxine Take 100 mcg by mouth daily before breakfast.   temazepam 15 MG capsule Commonly known as:  RESTORIL Take 15 mg by mouth at bedtime as needed for sleep.   vitamin C 500 MG tablet Commonly known as:  ASCORBIC ACID Take 500 mg by mouth daily.   Voltaren 1 % Gel Generic drug:  diclofenac sodium Apply 1 application topically at bedtime as  needed (for knee pain).       Allergies  Allergen Reactions  . Aspirin Swelling    CAN TOLERATE 81MG    Consultations:  Medical oncology, Dr. Maylon Peppers   Procedures/Studies: Ct Head Wo Contrast  Result Date: 09/21/2018 CLINICAL DATA:  Vision changes EXAM: CT HEAD WITHOUT CONTRAST CT MAXILLOFACIAL WITHOUT CONTRAST TECHNIQUE: Multidetector CT imaging of the head and maxillofacial structures were performed using the standard protocol without intravenous contrast. Multiplanar CT image reconstructions of the maxillofacial structures were also generated. COMPARISON:  None. FINDINGS: CT HEAD FINDINGS Brain: Mild atrophic changes are noted. No findings to suggest acute hemorrhage, acute infarction or space-occupying mass lesion are noted. Vascular: No hyperdense vessel or unexpected calcification. Skull: Normal. Negative  for fracture or focal lesion. Other: None. CT MAXILLOFACIAL FINDINGS Osseous: No acute fracture is noted. Chronic nasal bone fractures are noted stable from 2010. Orbits: Orbits and their contents are within normal limits. Sinuses: Paranasal sinuses demonstrate minimal mucosal thickening within the maxillary and ethmoid sinuses. No air-fluid levels are noted. Soft tissues: Negative. IMPRESSION: CT of the head: Mild atrophic changes. No acute intracranial abnormality noted. CT of the maxillofacial bones: Mucosal thickening within the axillary and ethmoid sinuses No bony abnormality is noted. Electronically Signed   By: Inez Catalina M.D.   On: 09/21/2018 00:07   Ct Angio Chest Pe W And/or Wo Contrast  Result Date: 09/21/2018 CLINICAL DATA:  83 y/o  F; dyspnea, positive D-dimer. EXAM: CT ANGIOGRAPHY CHEST WITH CONTRAST TECHNIQUE: Multidetector CT imaging of the chest was performed using the standard protocol during bolus administration of intravenous contrast. Multiplanar CT image reconstructions and MIPs were obtained to evaluate the vascular anatomy. CONTRAST:  129m OMNIPAQUE IOHEXOL 350 MG/ML SOLN COMPARISON:  02/13/2008 CT chest. 5129 PET-CT. FINDINGS: Cardiovascular: Satisfactory opacification of the pulmonary arteries to the segmental level. No evidence of pulmonary embolism. Mild cardiomegaly, aortic atherosclerosis, and coronary artery calcific atherosclerosis. No pericardial effusion. Mediastinum/Nodes: No enlarged mediastinal, hilar, or axillary lymph nodes. Thyroidectomy. Normal trachea and esophagus. Lungs/Pleura: Right lower lobe pulmonary nodule has increased in size from 2009 now measuring 15 x 11 mm (AP by ML series 6, image 88). No consolidation, effusion, pneumothorax, or findings of edema. Upper Abdomen: Small hiatal hernia. Musculoskeletal: Right anterior 7 rib subacute fracture with callus. Advanced degenerative changes of the glenohumeral joints. No acute osseous abnormality. Review of the  MIP images confirms the above findings. IMPRESSION: 1. No evidence of pulmonary embolus.  No acute pulmonary process. 2. Right lower lobe pulmonary nodule has increased in size from 2009 now measuring up to 15 mm. Given slow growth this probably represents a well-differentiated neoplasm. Consider continued surveillance with 3 month CT chest, PET scan, and/or possibly tissue sampling. 3. Small hiatal hernia. 4. Aortic Atherosclerosis (ICD10-I70.0). Electronically Signed   By: LKristine GarbeM.D.   On: 09/21/2018 01:29   Dg Chest Port 1 View  Result Date: 09/20/2018 CLINICAL DATA:  83y/o F; shortness of breath and right-sided chest wall pain with movement. EXAM: PORTABLE CHEST 1 VIEW COMPARISON:  08/05/2018 chest radiograph FINDINGS: Stable mildly enlarged cardiac silhouette given projection and technique. Single lead AICD wire noted. No pacing device. Aortic atherosclerosis with calcification. Clear lungs. No pleural effusion or pneumothorax. Severe osteoarthrosis of the left glenohumeral joint. No acute osseous abnormality identified. IMPRESSION: No acute pulmonary process identified. Electronically Signed   By: LKristine GarbeM.D.   On: 09/20/2018 22:52   Vas UKoreaLower Extremity Venous (dvt)  Result Date: 09/21/2018  Lower Venous Study Indications: D dimer.  Performing Technologist: June Leap RDMS, RVT  Examination Guidelines: A complete evaluation includes B-mode imaging, spectral Doppler, color Doppler, and power Doppler as needed of all accessible portions of each vessel. Bilateral testing is considered an integral part of a complete examination. Limited examinations for reoccurring indications may be performed as noted.  +---------+---------------+---------+-----------+----------+-------+ RIGHT    CompressibilityPhasicitySpontaneityPropertiesSummary +---------+---------------+---------+-----------+----------+-------+ CFV      Full           Yes      Yes                           +---------+---------------+---------+-----------+----------+-------+ SFJ      Full                                                 +---------+---------------+---------+-----------+----------+-------+ FV Prox  Full                                                 +---------+---------------+---------+-----------+----------+-------+ FV Mid   Full                                                 +---------+---------------+---------+-----------+----------+-------+ FV DistalFull                                                 +---------+---------------+---------+-----------+----------+-------+ PFV      Full                                                 +---------+---------------+---------+-----------+----------+-------+ POP      Full           Yes      Yes                          +---------+---------------+---------+-----------+----------+-------+ PTV      Full                                                 +---------+---------------+---------+-----------+----------+-------+ PERO     Full                                                 +---------+---------------+---------+-----------+----------+-------+   +---------+---------------+---------+-----------+----------+-------+ LEFT     CompressibilityPhasicitySpontaneityPropertiesSummary +---------+---------------+---------+-----------+----------+-------+ CFV      Full           Yes      Yes                          +---------+---------------+---------+-----------+----------+-------+  SFJ      Full                                                 +---------+---------------+---------+-----------+----------+-------+ FV Prox  Full                                                 +---------+---------------+---------+-----------+----------+-------+ FV Mid   Full                                                 +---------+---------------+---------+-----------+----------+-------+ FV  DistalFull                                                 +---------+---------------+---------+-----------+----------+-------+ PFV      Full                                                 +---------+---------------+---------+-----------+----------+-------+ POP      Full           Yes      Yes                          +---------+---------------+---------+-----------+----------+-------+ PTV      Full                                                 +---------+---------------+---------+-----------+----------+-------+ PERO     Full                                                 +---------+---------------+---------+-----------+----------+-------+     Summary: Right: There is no evidence of deep vein thrombosis in the lower extremity. No cystic structure found in the popliteal fossa. Left: There is no evidence of deep vein thrombosis in the lower extremity. No cystic structure found in the popliteal fossa.  *See table(s) above for measurements and observations. Electronically signed by Servando Snare MD on 09/21/2018 at 10:35:05 AM.    Final    Ct Maxillofacial Wo Contrast  Result Date: 09/21/2018 CLINICAL DATA:  Vision changes EXAM: CT HEAD WITHOUT CONTRAST CT MAXILLOFACIAL WITHOUT CONTRAST TECHNIQUE: Multidetector CT imaging of the head and maxillofacial structures were performed using the standard protocol without intravenous contrast. Multiplanar CT image reconstructions of the maxillofacial structures were also generated. COMPARISON:  None. FINDINGS: CT HEAD FINDINGS Brain: Mild atrophic changes are noted. No findings to suggest acute hemorrhage, acute infarction or space-occupying mass lesion are noted. Vascular: No hyperdense vessel or unexpected calcification. Skull: Normal. Negative for fracture or focal lesion. Other:  None. CT MAXILLOFACIAL FINDINGS Osseous: No acute fracture is noted. Chronic nasal bone fractures are noted stable from 2010. Orbits: Orbits and their contents  are within normal limits. Sinuses: Paranasal sinuses demonstrate minimal mucosal thickening within the maxillary and ethmoid sinuses. No air-fluid levels are noted. Soft tissues: Negative. IMPRESSION: CT of the head: Mild atrophic changes. No acute intracranial abnormality noted. CT of the maxillofacial bones: Mucosal thickening within the axillary and ethmoid sinuses No bony abnormality is noted. Electronically Signed   By: Inez Catalina M.D.   On: 09/21/2018 00:07     Subjective: Patient seen and examined at bedside, resting comfortably.  Currently second units of PRBCs infusing.  Awaiting for platelets from [place] to arrive.  Patient has been accepted by oncology at [facility] for transfer.  States shortness of breath improved, but continues with mild dyspnea.  No other complaints at this time.  Denies headache, no visual changes, no chest pain, no palpitations, no nausea/vomiting/diarrhea, no cough/congestion, no abdominal pain, denies any bleeding, no fever/chills/night sweats, no paresthesias.  No acute events overnight per nursing staff.   Discharge Exam: Vitals:   09/21/18 1100 09/21/18 1223  BP: (!) 153/54 (!) 129/46  Pulse: 81 81  Resp: (!) 25 20  Temp:  99 F (37.2 C)  SpO2: 97% 98%   Vitals:   09/21/18 0930 09/21/18 1008 09/21/18 1100 09/21/18 1223  BP: (!) 137/43 (!) 148/46 (!) 153/54 (!) 129/46  Pulse: 78 79 81 81  Resp: (!) 23 15 (!) 25 20  Temp: 98.9 F (37.2 C) 98.4 F (36.9 C)  99 F (37.2 C)  TempSrc: Oral Oral  Oral  SpO2: 98% 98% 97% 98%    General: Pt is alert, awake, not in acute distress Cardiovascular: RRR, S1/S2 +, no rubs, no gallops Respiratory: CTA bilaterally, no wheezing, no rhonchi Abdominal: Soft, NT, ND, bowel sounds + Extremities: no edema, no cyanosis    The results of significant diagnostics from this hospitalization (including imaging, microbiology, ancillary and laboratory) are listed below for reference.      Microbiology: Recent Results (from the past 240 hour(s))  SARS Coronavirus 2 [facility] order, Performed in [facility] hospital lab)     Status: None   Collection Time: 09/20/18 10:07 PM  Result Value Ref Range Status   SARS Coronavirus 2 NEGATIVE NEGATIVE Final    Comment: (NOTE) If result is NEGATIVE SARS-CoV-2 target nucleic acids are NOT DETECTED. The SARS-CoV-2 RNA is generally detectable in upper and lower  respiratory specimens during the acute phase of infection. The lowest  concentration of SARS-CoV-2 viral copies this assay can detect is 250  copies / mL. A negative result does not preclude SARS-CoV-2 infection  and should not be used as the sole basis for treatment or other  patient management decisions.  A negative result may occur with  improper specimen collection / handling, submission of specimen other  than nasopharyngeal swab, presence of viral mutation(s) within the  areas targeted by this assay, and inadequate number of viral copies  (<250 copies / mL). A negative result must be combined with clinical  observations, patient history, and epidemiological information. If result is POSITIVE SARS-CoV-2 target nucleic acids are DETECTED. The SARS-CoV-2 RNA is generally detectable in upper and lower  respiratory specimens dur ing the acute phase of infection.  Positive  results are indicative of active infection with SARS-CoV-2.  Clinical  correlation with patient history and other diagnostic information is  necessary to determine patient infection status.  Positive results do  not rule out bacterial infection or co-infection with other viruses. If result is PRESUMPTIVE POSTIVE SARS-CoV-2 nucleic acids MAY BE PRESENT.   A presumptive positive result was obtained on the submitted specimen  and confirmed on repeat testing.  While 2019 novel coronavirus  (SARS-CoV-2) nucleic acids may be present in the submitted sample  additional confirmatory testing may be  necessary for epidemiological  and / or clinical management purposes  to differentiate between  SARS-CoV-2 and other Sarbecovirus currently known to infect humans.  If clinically indicated additional testing with an alternate test  methodology [phone]) is advised. The SARS-CoV-2 RNA is generally  detectable in upper and lower respiratory sp ecimens during the acute  phase of infection. The expected result is Negative. Fact Sheet for Patients:  StrictlyIdeas.no Fact Sheet for Healthcare Providers: BankingDealers.co.za This test is not yet approved or cleared by the [place] FDA and has been authorized for detection and/or diagnosis of SARS-CoV-2 by FDA under an Emergency Use Authorization (EUA).  This EUA will remain in effect (meaning this test can be used) for the duration of the COVID-19 declaration under Section 564(b)(1) of the Act, 21 U.S.C. section 360bbb-3(b)(1), unless the authorization is terminated or revoked sooner. Performed at [facility], [place] [address], [place], [place] [address]      Labs: BNP (last 3 results) Recent Labs    09/20/18 2208  BNP 557.3*   Basic Metabolic Panel: Recent Labs  Lab 09/20/18 2207  NA 137  K 5.1  CL 109  CO2 21*  GLUCOSE 151*  BUN 33*  CREATININE 0.95  CALCIUM 8.7*   Liver Function Tests: Recent Labs  Lab 09/20/18 2207  AST 18  ALT 10  ALKPHOS 51  BILITOT 0.6  PROT 6.4*  ALBUMIN 3.1*   No results for input(s): LIPASE, AMYLASE in the last 168 hours. No results for input(s): AMMONIA in the last 168 hours. CBC: Recent Labs  Lab 09/20/18 2207  WBC 3.7*  NEUTROABS 0.3*  HGB 4.0*  HCT 12.6*  MCV 99.2  PLT 3*   Cardiac Enzymes: Recent Labs  Lab 09/20/18 2311  TROPONINI <0.03   BNP: Invalid input(s): POCBNP CBG: Recent Labs  Lab 09/21/18 0803 09/21/18 1126  GLUCAP 106* 132*   D-Dimer Recent Labs    09/20/18 2207  DDIMER 2.59*    Hgb A1c No results for input(s): HGBA1C in the last 72 hours. Lipid Profile Recent Labs    09/20/18 2207  TRIG 108   Thyroid function studies No results for input(s): TSH, T4TOTAL, T3FREE, THYROIDAB in the last 72 hours.  Invalid input(s): FREET3 Anemia work up Recent Labs    09/20/18 2207 09/20/18 2335  VITAMINB12  --  784  FOLATE  --  22.0  FERRITIN 625*  --   TIBC  --  299  IRON  --  184*  RETICCTPCT  --  0.3*   Urinalysis    Component Value Date/Time   COLORURINE YELLOW 09/20/2018 2224   APPEARANCEUR HAZY (A) 09/20/2018 2224   LABSPEC 1.015 09/20/2018 2224   PHURINE 6.0 09/20/2018 2224   GLUCOSEU NEGATIVE 09/20/2018 2224   HGBUR SMALL (A) 09/20/2018 2224   BILIRUBINUR NEGATIVE 09/20/2018 2224   KETONESUR NEGATIVE 09/20/2018 2224   PROTEINUR NEGATIVE 09/20/2018 2224   UROBILINOGEN 0.2 03/24/2009 1208   NITRITE POSITIVE (A) 09/20/2018 2224   LEUKOCYTESUR NEGATIVE 09/20/2018 2224   Sepsis Labs Invalid input(s): PROCALCITONIN,  WBC,  LACTICIDVEN Microbiology Recent Results (from the past  240 hour(s))  SARS Coronavirus 2 [facility] order, Performed in [facility] hospital lab)     Status: None   Collection Time: 09/20/18 10:07 PM  Result Value Ref Range Status   SARS Coronavirus 2 NEGATIVE NEGATIVE Final    Comment: (NOTE) If result is NEGATIVE SARS-CoV-2 target nucleic acids are NOT DETECTED. The SARS-CoV-2 RNA is generally detectable in upper and lower  respiratory specimens during the acute phase of infection. The lowest  concentration of SARS-CoV-2 viral copies this assay can detect is 250  copies / mL. A negative result does not preclude SARS-CoV-2 infection  and should not be used as the sole basis for treatment or other  patient management decisions.  A negative result may occur with  improper specimen collection / handling, submission of specimen other  than nasopharyngeal swab, presence of viral mutation(s) within the  areas targeted by this  assay, and inadequate number of viral copies  (<250 copies / mL). A negative result must be combined with clinical  observations, patient history, and epidemiological information. If result is POSITIVE SARS-CoV-2 target nucleic acids are DETECTED. The SARS-CoV-2 RNA is generally detectable in upper and lower  respiratory specimens dur ing the acute phase of infection.  Positive  results are indicative of active infection with SARS-CoV-2.  Clinical  correlation with patient history and other diagnostic information is  necessary to determine patient infection status.  Positive results do  not rule out bacterial infection or co-infection with other viruses. If result is PRESUMPTIVE POSTIVE SARS-CoV-2 nucleic acids MAY BE PRESENT.   A presumptive positive result was obtained on the submitted specimen  and confirmed on repeat testing.  While 2019 novel coronavirus  (SARS-CoV-2) nucleic acids may be present in the submitted sample  additional confirmatory testing may be necessary for epidemiological  and / or clinical management purposes  to differentiate between  SARS-CoV-2 and other Sarbecovirus currently known to infect humans.  If clinically indicated additional testing with an alternate test  methodology [phone]) is advised. The SARS-CoV-2 RNA is generally  detectable in upper and lower respiratory sp ecimens during the acute  phase of infection. The expected result is Negative. Fact Sheet for Patients:  StrictlyIdeas.no Fact Sheet for Healthcare Providers: BankingDealers.co.za This test is not yet approved or cleared by the [place] FDA and has been authorized for detection and/or diagnosis of SARS-CoV-2 by FDA under an Emergency Use Authorization (EUA).  This EUA will remain in effect (meaning this test can be used) for the duration of the COVID-19 declaration under Section 564(b)(1) of the Act, 21 U.S.C. section 360bbb-3(b)(1),  unless the authorization is terminated or revoked sooner. Performed at [facility], [place] [address], [place], [place] [address]      Time coordinating discharge: Over 30 minutes  SIGNED:   Emir Nack J [place] DO  [facility] 09/21/2018, 12:38 PM

## 2018-09-21 NOTE — ED Notes (Signed)
ED TO INPATIENT HANDOFF REPORT  ED Nurse Name and Phone #: Laurelyn Sickle [phone]  S Name/Age/Gender [name] 83 y.o. female Room/Bed: WA24/WA24  Code Status   Code Status: Full Code  Home/SNF/Other Home Patient oriented to: self, place, time and situation Is this baseline? Yes   Triage Complete: Triage complete  Chief Complaint sinus infection  Triage Note Pt comes to ed via ems, pt has a been diagnose on Tuesday with sinus infection but has not been able to get to the drug store. V/s 110/64, hr 80,cbg 188  rr18, spo2 96 room air. Complains of SOB and right side chest wall pain on movement.      Allergies Allergies  Allergen Reactions  . Aspirin Swelling    CAN TOLERATE 81MG     Level of Care/Admitting Diagnosis ED Disposition    ED Disposition Condition [facility] Area: [place] [[id number]]  Level of Care: Stepdown [14]  Admit to SDU based on following criteria: Hemodynamic compromise or significant risk of instability:  Patient requiring short term acute titration and management of vasoactive drips, and invasive monitoring (i.e., CVP and Arterial line).  Covid Evaluation: N/A  Diagnosis: Pancytopenia [facility]) [[id number]]  Admitting Physician: Ivor Costa [[id number]]  Attending Physician: Ivor Costa [phone]  Estimated length of stay: past midnight tomorrow  Certification:: I certify this patient will need inpatient services for at least 2 midnights  Bed request comments: COVID 19 negative  PT Class (Do Not Modify): Inpatient [101]  PT Acc Code (Do Not Modify): Private [1]       B Medical/Surgery History Past Medical History:  Diagnosis Date  . Ankle fracture   . Colitis   . DCM (dilated cardiomyopathy) ([place])    a. previously idiopathic with EF 20% in 2006 s/p ICD with subsequent normalization of EF and removal of defibrillator  . Depression   . Diastolic dysfunction   . Diverticula, colon   . Dyslipidemia   . Fecal  incontinence    mild  . GERD (gastroesophageal reflux disease)   . Hypertension   . Mild aortic insufficiency   . Normal coronary arteries 2006  . Osteoarthritis    both knees  . Overactive bladder   . Post-surgical hypothyroidism   . Pulmonary nodule   . Seasonal allergies   . Thyroid nodule    papillary cancer   Past Surgical History:  Procedure Laterality Date  . ABDOMINAL HYSTERECTOMY    . BROKEN ANKLE    . CARDIAC DEFIBRILLATOR PLACEMENT    . GENERATOR REMOVAL N/A 07/20/2014   Procedure: ICD GENERATOR REMOVAL;  Surgeon: Evans Lance, MD;  Location: [facility] CATH LAB;  Service: Cardiovascular;  Laterality: N/A;  . KNEE SURGERY  01/2002   both  . left ankle surgery  08/1999  . NASAL SINUS SURGERY    . NOSE SURGERY  1987  . THYROIDECTOMY  03/29/2009   papillary cancer  . TONSILECTOMY, ADENOIDECTOMY, BILATERAL MYRINGOTOMY AND TUBES  1956  . TONSILLECTOMY    . TORE KNEE    . TOTAL ABDOMINAL HYSTERECTOMY W/ BILATERAL SALPINGOOPHORECTOMY  1988     A IV Location/Drains/Wounds Patient Lines/Drains/Airways Status   Active Line/Drains/Airways    Name:   Placement date:   Placement time:   Site:   Days:   Peripheral IV 09/20/18 Left Arm   09/20/18    2245    Arm   1   Peripheral IV 09/21/18 Right Antecubital   09/21/18  0013    Antecubital   less than 1          Intake/Output Last 24 hours  Intake/Output Summary (Last 24 hours) at 09/21/2018 0423 Last data filed at 09/21/2018 0203 Gross per 24 hour  Intake 315 ml  Output 150 ml  Net 165 ml    Labs/Imaging Results for orders placed or performed during the hospital encounter of 09/20/18 (from the past 48 hour(s))  ABO/Rh     Status: None (Preliminary result)   Collection Time: 09/20/18 11:45 AM  Result Value Ref Range   ABO/RH(D)      B POS Performed at [facility], [place] [address], [place], [place] [address]   SARS Coronavirus 2 [facility] order, Performed in [facility] hospital lab)     Status:  None   Collection Time: 09/20/18 10:07 PM  Result Value Ref Range   SARS Coronavirus 2 NEGATIVE NEGATIVE    Comment: (NOTE) If result is NEGATIVE SARS-CoV-2 target nucleic acids are NOT DETECTED. The SARS-CoV-2 RNA is generally detectable in upper and lower  respiratory specimens during the acute phase of infection. The lowest  concentration of SARS-CoV-2 viral copies this assay can detect is 250  copies / mL. A negative result does not preclude SARS-CoV-2 infection  and should not be used as the sole basis for treatment or other  patient management decisions.  A negative result may occur with  improper specimen collection / handling, submission of specimen other  than nasopharyngeal swab, presence of viral mutation(s) within the  areas targeted by this assay, and inadequate number of viral copies  (<250 copies / mL). A negative result must be combined with clinical  observations, patient history, and epidemiological information. If result is POSITIVE SARS-CoV-2 target nucleic acids are DETECTED. The SARS-CoV-2 RNA is generally detectable in upper and lower  respiratory specimens dur ing the acute phase of infection.  Positive  results are indicative of active infection with SARS-CoV-2.  Clinical  correlation with patient history and other diagnostic information is  necessary to determine patient infection status.  Positive results do  not rule out bacterial infection or co-infection with other viruses. If result is PRESUMPTIVE POSTIVE SARS-CoV-2 nucleic acids MAY BE PRESENT.   A presumptive positive result was obtained on the submitted specimen  and confirmed on repeat testing.  While 2019 novel coronavirus  (SARS-CoV-2) nucleic acids may be present in the submitted sample  additional confirmatory testing may be necessary for epidemiological  and / or clinical management purposes  to differentiate between  SARS-CoV-2 and other Sarbecovirus currently known to infect humans.  If  clinically indicated additional testing with an alternate test  methodology [phone]) is advised. The SARS-CoV-2 RNA is generally  detectable in upper and lower respiratory sp ecimens during the acute  phase of infection. The expected result is Negative. Fact Sheet for Patients:  StrictlyIdeas.no Fact Sheet for Healthcare Providers: BankingDealers.co.za This test is not yet approved or cleared by the [place] FDA and has been authorized for detection and/or diagnosis of SARS-CoV-2 by FDA under an Emergency Use Authorization (EUA).  This EUA will remain in effect (meaning this test can be used) for the duration of the COVID-19 declaration under Section 564(b)(1) of the Act, 21 U.S.C. section 360bbb-3(b)(1), unless the authorization is terminated or revoked sooner. Performed at [facility], [place] [address], [place], [place] [address]   Lactic acid, plasma     Status: Abnormal   Collection Time: 09/20/18 10:07  PM  Result Value Ref Range   Lactic Acid, Venous 2.2 (HH) 0.5 - 1.9 mmol/L    Comment: CRITICAL RESULT CALLED TO, READ BACK BY AND VERIFIED WITH: Hulda Marin RN @2335  ON 09/20/2018 JACKSON,K Performed at [facility], [place] [address] [place], [place] [address]   CBC WITH DIFFERENTIAL     Status: Abnormal   Collection Time: 09/20/18 10:07 PM  Result Value Ref Range   WBC 3.7 (L) 4.0 - 10.5 K/uL   RBC 1.27 (L) 3.87 - 5.11 MIL/uL   Hemoglobin 4.0 (LL) 12.0 - 15.0 g/dL    Comment: REPEATED TO VERIFY THIS CRITICAL RESULT HAS VERIFIED AND BEEN CALLED TO S,FARRAR BY AISHA MOHAMED ON 04 17 2020 AT 2330, AND HAS BEEN READ BACK.     HCT 12.6 (L) 36.0 - 46.0 %   MCV 99.2 80.0 - 100.0 fL   MCH 31.5 26.0 - 34.0 pg   MCHC 31.7 30.0 - 36.0 g/dL   RDW 15.9 (H) 11.5 - 15.5 %   Platelets 3 (LL) 150 - 400 K/uL    Comment: REPEATED TO VERIFY PLATELET COUNT CONFIRMED BY SMEAR SPECIMEN CHECKED FOR  CLOTS Immature Platelet Fraction may be clinically indicated, consider ordering this additional test [id number] CRITICAL RESULT CALLED TO, READ BACK BY AND VERIFIED WITH: S,FARRAR AT 2330 ON 09/20/18 BY A,MOHAMED    nRBC 3.5 (H) 0.0 - 0.2 %   Neutrophils Relative % 7 %   Neutro Abs 0.3 (L) 1.7 - 7.7 K/uL   Lymphocytes Relative 77 %   Lymphs Abs 2.9 0.7 - 4.0 K/uL   Monocytes Relative 14 %   Monocytes Absolute 0.5 0.1 - 1.0 K/uL   Eosinophils Relative 1 %   Eosinophils Absolute 0.0 0.0 - 0.5 K/uL   Basophils Relative 0 %   Basophils Absolute 0.0 0.0 - 0.1 K/uL   WBC Morphology DOHLE BODIES     Comment: MILD LEFT SHIFT (1-5% METAS, OCC MYELO, OCC BANDS)   RBC Morphology RARE NRBCs    Immature Granulocytes 1 %   Abs Immature Granulocytes 0.02 0.00 - 0.07 K/uL   Abnormal Lymphocytes Present PRESENT    Polychromasia PRESENT    Ovalocytes PRESENT     Comment: Performed at [facility], [place] [address] [place], [place] [address]  Comprehensive metabolic panel     Status: Abnormal   Collection Time: 09/20/18 10:07 PM  Result Value Ref Range   Sodium 137 135 - 145 mmol/L   Potassium 5.1 3.5 - 5.1 mmol/L   Chloride 109 98 - 111 mmol/L   CO2 21 (L) 22 - 32 mmol/L   Glucose, Bld 151 (H) 70 - 99 mg/dL   BUN 33 (H) 8 - 23 mg/dL   Creatinine, Ser 0.95 0.44 - 1.00 mg/dL   Calcium 8.7 (L) 8.9 - 10.3 mg/dL   Total Protein 6.4 (L) 6.5 - 8.1 g/dL   Albumin 3.1 (L) 3.5 - 5.0 g/dL   AST 18 15 - 41 U/L   ALT 10 0 - 44 U/L   Alkaline Phosphatase 51 38 - 126 U/L   Total Bilirubin 0.6 0.3 - 1.2 mg/dL   GFR calc non Af Amer 57 (L) >60 mL/min   GFR calc Af Amer >60 >60 mL/min   Anion gap 7 5 - 15    Comment: Performed at [facility], [place] [address] [place], [place] [address]  D-dimer, quantitative     Status: Abnormal   Collection Time: 09/20/18  10:07 PM  Result Value Ref Range   D-Dimer, Quant 2.59 (H) 0.00 - 0.50 ug/mL-FEU    Comment: (NOTE) At the  manufacturer cut-off of 0.50 ug/mL FEU, this assay has been documented to exclude PE with a sensitivity and negative predictive value of 97 to 99%.  At this time, this assay has not been approved by the FDA to exclude DVT/VTE. Results should be correlated with clinical presentation. Performed at [facility], [place] [address], [place],  [address]   Procalcitonin     Status: None   Collection Time: 09/20/18 10:07 PM  Result Value Ref Range   Procalcitonin 0.10 ng/mL    Comment:        Interpretation: PCT (Procalcitonin) <= 0.5 ng/mL: Systemic infection (sepsis) is not likely. Local bacterial infection is possible. (NOTE)       Sepsis PCT Algorithm           Lower Respiratory Tract                                      Infection PCT Algorithm    ----------------------------     ----------------------------         PCT < 0.25 ng/mL                PCT < 0.10 ng/mL         Strongly encourage             Strongly discourage   discontinuation of antibiotics    initiation of antibiotics    ----------------------------     -----------------------------       PCT 0.25 - 0.50 ng/mL            PCT 0.10 - 0.25 ng/mL               OR       >80% decrease in PCT            Discourage initiation of                                            antibiotics      Encourage discontinuation           of antibiotics    ----------------------------     -----------------------------         PCT >= 0.50 ng/mL              PCT 0.26 - 0.50 ng/mL               AND        <80% decrease in PCT             Encourage initiation of                                             antibiotics       Encourage continuation           of antibiotics    ----------------------------     -----------------------------        PCT >= 0.50 ng/mL                  PCT >  0.50 ng/mL               AND         increase in PCT                  Strongly encourage                                      initiation  of antibiotics    Strongly encourage escalation           of antibiotics                                     -----------------------------                                           PCT <= 0.25 ng/mL                                                 OR                                        > 80% decrease in PCT                                     Discontinue / Do not initiate                                             antibiotics Performed at [place] [address] [place], [place] [address]   Lactate dehydrogenase     Status: Abnormal   Collection Time: 09/20/18 10:07 PM  Result Value Ref Range   LDH 515 (H) 98 - 192 U/L    Comment: Performed at [facility], [place] [address] [place], [place] [address]  Ferritin     Status: Abnormal   Collection Time: 09/20/18 10:07 PM  Result Value Ref Range   Ferritin 625 (H) 11 - 307 ng/mL    Comment: Performed at [facility], [place] [address], [place], [place] [address]  Triglycerides     Status: None   Collection Time: 09/20/18 10:07 PM  Result Value Ref Range   Triglycerides 108 <150 mg/dL    Comment: Performed at [facility], [place] [address] [place], [place] [address]  Fibrinogen     Status: Abnormal   Collection Time: 09/20/18 10:07 PM  Result Value Ref Range   Fibrinogen 568 (H) 210 - 475 mg/dL    Comment: Performed at [facility], [place] [address], [place], [place] [address]  C-reactive protein     Status: Abnormal   Collection Time: 09/20/18 10:07 PM  Result Value Ref Range   CRP 9.3 (H) <1.0 mg/dL    Comment: Performed at [facility], [place] [address], [place], [place] [address]  Brain natriuretic peptide     Status: Abnormal   Collection Time: 09/20/18 10:08 PM  Result Value Ref Range   B Natriuretic Peptide 167.0 (H) 0.0 - 100.0 pg/mL    Comment: Performed at [facility], [place] [address],  [place], [place] [address]  Urinalysis, Routine w reflex microscopic     Status: Abnormal   Collection Time: 09/20/18 10:24 PM  Result Value Ref Range   Color, Urine YELLOW YELLOW   APPearance HAZY (A) CLEAR   Specific Gravity, Urine 1.015 1.005 - 1.030   pH 6.0 5.0 - 8.0   Glucose, UA NEGATIVE NEGATIVE mg/dL   Hgb urine dipstick SMALL (A) NEGATIVE   Bilirubin Urine NEGATIVE NEGATIVE   Ketones, ur NEGATIVE NEGATIVE mg/dL   Protein, ur NEGATIVE NEGATIVE mg/dL   Nitrite POSITIVE (A) NEGATIVE   Leukocytes,Ua NEGATIVE NEGATIVE    Comment: Performed at [facility], [place] [address] [place], [place] [address]  Urinalysis, Microscopic (reflex)     Status: Abnormal   Collection Time: 09/20/18 10:24 PM  Result Value Ref Range   RBC / HPF 0-5 0 - 5 RBC/hpf   WBC, UA 6-10 0 - 5 WBC/hpf   Bacteria, UA MANY (A) NONE SEEN   Squamous Epithelial / LPF 0-5 0 - 5    Comment: Performed at [facility], [place] [address], [place], [place] [address]  Troponin I - ONCE - STAT     Status: None   Collection Time: 09/20/18 11:11 PM  Result Value Ref Range   Troponin I <0.03 <0.03 ng/mL    Comment: Performed at [facility], [place] [address], [place], [place] [address]  Vitamin B12     Status: None   Collection Time: 09/20/18 11:35 PM  Result Value Ref Range   Vitamin B-12 784 180 - 914 pg/mL    Comment: (NOTE) This assay is not validated for testing neonatal or myeloproliferative syndrome specimens for Vitamin B12 levels. Performed at [facility], [place] [address] [place], [place] [address]   Folate     Status: None   Collection Time: 09/20/18 11:35 PM  Result Value Ref Range   Folate 22.0 >5.9 ng/mL    Comment: Performed at [facility], [place] [address] [place], [place] [address]  Iron and TIBC     Status: Abnormal   Collection Time: 09/20/18 11:35 PM  Result Value Ref Range   Iron 184 (H) 28 - 170 ug/dL    TIBC 299 250 - 450 ug/dL   Saturation Ratios 62 (H) 10.4 - 31.8 %   UIBC 115 ug/dL    Comment: Performed at [facility], [place] [address] [place], [place] [address]  Reticulocytes     Status: Abnormal   Collection Time: 09/20/18 11:35 PM  Result Value Ref Range   Retic Ct Pct 0.3 (L) 0.4 - 3.1 %   RBC. 1.26 (L) 3.87 - 5.11 MIL/uL   Retic Count, Absolute 4.2 (L) 19.0 - 186.0 K/uL   Immature Retic Fract 26.4 (H) 2.3 - 15.9 %    Comment: Performed at [facility], [place] [address], [place],  [address]  Type and screen [place]     Status: None (Preliminary result)   Collection Time: 09/20/18 11:45 PM  Result Value Ref Range   ABO/RH(D) B POS    Antibody Screen NEG  Sample Expiration 09/23/2018    Unit Number [id number]    Blood Component Type RED CELLS,LR    Unit division 00    Status of Unit ISSUED    Transfusion Status OK TO TRANSFUSE    Crossmatch Result      Compatible Performed at [place] [address] [place], [place] [address]    Unit Number [id number]    Blood Component Type RED CELLS,LR    Unit division 00    Status of Unit ALLOCATED    Transfusion Status OK TO TRANSFUSE    Crossmatch Result Compatible   Prepare RBC     Status: None   Collection Time: 09/20/18 11:45 PM  Result Value Ref Range   Order Confirmation      ORDER PROCESSED BY BLOOD BANK Performed at [facility], [place] [address], [place], [place] [address]   Lactic acid, plasma     Status: None   Collection Time: 09/21/18 12:07 AM  Result Value Ref Range   Lactic Acid, Venous 1.4 0.5 - 1.9 mmol/L    Comment: Performed at [facility], [place] [address], [place], [place] [address]  POC occult blood, ED     Status: Abnormal   Collection Time: 09/21/18 12:10 AM  Result Value Ref Range   Fecal Occult Bld POSITIVE (A) NEGATIVE   Ct Head Wo Contrast  Result Date:  09/21/2018 CLINICAL DATA:  Vision changes EXAM: CT HEAD WITHOUT CONTRAST CT MAXILLOFACIAL WITHOUT CONTRAST TECHNIQUE: Multidetector CT imaging of the head and maxillofacial structures were performed using the standard protocol without intravenous contrast. Multiplanar CT image reconstructions of the maxillofacial structures were also generated. COMPARISON:  None. FINDINGS: CT HEAD FINDINGS Brain: Mild atrophic changes are noted. No findings to suggest acute hemorrhage, acute infarction or space-occupying mass lesion are noted. Vascular: No hyperdense vessel or unexpected calcification. Skull: Normal. Negative for fracture or focal lesion. Other: None. CT MAXILLOFACIAL FINDINGS Osseous: No acute fracture is noted. Chronic nasal bone fractures are noted stable from 2010. Orbits: Orbits and their contents are within normal limits. Sinuses: Paranasal sinuses demonstrate minimal mucosal thickening within the maxillary and ethmoid sinuses. No air-fluid levels are noted. Soft tissues: Negative. IMPRESSION: CT of the head: Mild atrophic changes. No acute intracranial abnormality noted. CT of the maxillofacial bones: Mucosal thickening within the axillary and ethmoid sinuses No bony abnormality is noted. Electronically Signed   By: Inez Catalina M.D.   On: 09/21/2018 00:07   Ct Angio Chest Pe W And/or Wo Contrast  Result Date: 09/21/2018 CLINICAL DATA:  83 y/o  F; dyspnea, positive D-dimer. EXAM: CT ANGIOGRAPHY CHEST WITH CONTRAST TECHNIQUE: Multidetector CT imaging of the chest was performed using the standard protocol during bolus administration of intravenous contrast. Multiplanar CT image reconstructions and MIPs were obtained to evaluate the vascular anatomy. CONTRAST:  115mL OMNIPAQUE IOHEXOL 350 MG/ML SOLN COMPARISON:  02/13/2008 CT chest. 5129 PET-CT. FINDINGS: Cardiovascular: Satisfactory opacification of the pulmonary arteries to the segmental level. No evidence of pulmonary embolism. Mild cardiomegaly, aortic  atherosclerosis, and coronary artery calcific atherosclerosis. No pericardial effusion. Mediastinum/Nodes: No enlarged mediastinal, hilar, or axillary lymph nodes. Thyroidectomy. Normal trachea and esophagus. Lungs/Pleura: Right lower lobe pulmonary nodule has increased in size from 2009 now measuring 15 x 11 mm (AP by ML series 6, image 88). No consolidation, effusion, pneumothorax, or findings of edema. Upper Abdomen: Small hiatal hernia. Musculoskeletal: Right anterior 7 rib subacute fracture with callus. Advanced degenerative changes of the glenohumeral joints.  No acute osseous abnormality. Review of the MIP images confirms the above findings. IMPRESSION: 1. No evidence of pulmonary embolus.  No acute pulmonary process. 2. Right lower lobe pulmonary nodule has increased in size from 2009 now measuring up to 15 mm. Given slow growth this probably represents a well-differentiated neoplasm. Consider continued surveillance with 3 month CT chest, PET scan, and/or possibly tissue sampling. 3. Small hiatal hernia. 4. Aortic Atherosclerosis (ICD10-I70.0). Electronically Signed   By: Kristine Garbe M.D.   On: 09/21/2018 01:29   Dg Chest Port 1 View  Result Date: 09/20/2018 CLINICAL DATA:  83 y/o F; shortness of breath and right-sided chest wall pain with movement. EXAM: PORTABLE CHEST 1 VIEW COMPARISON:  08/05/2018 chest radiograph FINDINGS: Stable mildly enlarged cardiac silhouette given projection and technique. Single lead AICD wire noted. No pacing device. Aortic atherosclerosis with calcification. Clear lungs. No pleural effusion or pneumothorax. Severe osteoarthrosis of the left glenohumeral joint. No acute osseous abnormality identified. IMPRESSION: No acute pulmonary process identified. Electronically Signed   By: Kristine Garbe M.D.   On: 09/20/2018 22:52   Ct Maxillofacial Wo Contrast  Result Date: 09/21/2018 CLINICAL DATA:  Vision changes EXAM: CT HEAD WITHOUT CONTRAST CT  MAXILLOFACIAL WITHOUT CONTRAST TECHNIQUE: Multidetector CT imaging of the head and maxillofacial structures were performed using the standard protocol without intravenous contrast. Multiplanar CT image reconstructions of the maxillofacial structures were also generated. COMPARISON:  None. FINDINGS: CT HEAD FINDINGS Brain: Mild atrophic changes are noted. No findings to suggest acute hemorrhage, acute infarction or space-occupying mass lesion are noted. Vascular: No hyperdense vessel or unexpected calcification. Skull: Normal. Negative for fracture or focal lesion. Other: None. CT MAXILLOFACIAL FINDINGS Osseous: No acute fracture is noted. Chronic nasal bone fractures are noted stable from 2010. Orbits: Orbits and their contents are within normal limits. Sinuses: Paranasal sinuses demonstrate minimal mucosal thickening within the maxillary and ethmoid sinuses. No air-fluid levels are noted. Soft tissues: Negative. IMPRESSION: CT of the head: Mild atrophic changes. No acute intracranial abnormality noted. CT of the maxillofacial bones: Mucosal thickening within the axillary and ethmoid sinuses No bony abnormality is noted. Electronically Signed   By: Inez Catalina M.D.   On: 09/21/2018 00:07    Pending Labs Unresulted Labs (From admission, onward)    Start     Ordered   09/21/18 0500  CBC  Now then every 6 hours,   R     09/21/18 0329   09/21/18 9381  Basic metabolic panel  Tomorrow morning,   R     09/21/18 0333   09/21/18 0330  Urine Culture  Add-on,   R    Comments:  Please get clean catch specimen ONLY (DO NOT obtain from urinal)    09/21/18 0330   09/21/18 0238  Prepare pheresed platelets  (Emergency Blood Administration - Platelets (Pheresed))  Once,   STAT    Question Answer Comment  Number of Apheresis Units 1 unit (6-10 packs)   Transfusion Indications Bleeding from Thrombocytopenia   Special Requirements Irradiated      09/21/18 0237   09/21/18 0222  Protime-INR  ONCE - STAT,   R      09/21/18 0221   09/21/18 0222  APTT  ONCE - STAT,   R     09/21/18 0221   09/21/18 0222  Uric acid  ONCE - STAT,   R     09/21/18 0221   09/21/18 0221  HIV Antibody (routine testing w rflx)  ONCE - STAT,  R     09/21/18 0221   09/21/18 0221  Hepatitis B surface antibody  ONCE - STAT,   R     09/21/18 0221   09/21/18 0221  Hepatitis B surface antigen  ONCE - STAT,   R     09/21/18 0221   09/21/18 0221  Hepatitis B core antibody, total  ONCE - STAT,   R     09/21/18 0221   09/21/18 0221  Hepatitis C antibody  ONCE - STAT,   R     09/21/18 0221   09/21/18 0221  Lymphocyte subsets,flow cytometry (InPt)  ONCE - STAT,   R    Question:  Test for these components:  Answer:  Abnormal blasts on peripheral blood smear, concerning for leukemia. Myeloid vs. lymphoid unknown.   09/21/18 0221   09/20/18 2341  Prepare pheresed platelets  (Emergency Blood Administration - Platelets (Pheresed))  Once,   STAT    Question Answer Comment  Number of Apheresis Units 2 units (12-20 packs)   Transfusion Indications Bleeding from Thrombocytopenia      09/20/18 2341   09/20/18 2207  Blood Culture (routine x 2)  BLOOD CULTURE X 2,   STAT    Question:  Patient immune status  Answer:  Normal   09/20/18 2207   09/20/18 2207  Pathologist smear review  Once,   R     09/20/18 2207          Vitals/Pain Today's Vitals   09/21/18 0231 09/21/18 0300 09/21/18 0330 09/21/18 0400  BP: (!) 131/59 (!) 106/49 (!) 122/50 (!) 124/51  Pulse: 92 94 92 86  Resp: 20 (!) 23 19 15   Temp: 99.2 F (37.3 C)     TempSrc: Oral     SpO2: 99% 100% 100% 100%  PainSc:        Isolation Precautions No active isolations  Medications Medications  sodium chloride (PF) 0.9 % injection (has no administration in time range)  carvedilol (COREG) tablet 25 mg (has no administration in time range)  rosuvastatin (CRESTOR) tablet 5 mg (has no administration in time range)  temazepam (RESTORIL) capsule 15 mg (has no administration in  time range)  levothyroxine (SYNTHROID) tablet 100 mcg (has no administration in time range)  mesalamine (LIALDA) EC tablet 1.2 g (has no administration in time range)  oxybutynin (DITROPAN-XL) 24 hr tablet 5 mg (has no administration in time range)  montelukast (SINGULAIR) tablet 10 mg (has no administration in time range)  cefTRIAXone (ROCEPHIN) 1 g in sodium chloride 0.9 % 100 mL IVPB (has no administration in time range)  pantoprazole (PROTONIX) injection 40 mg (has no administration in time range)  acetaminophen (TYLENOL) tablet 650 mg (has no administration in time range)    Or  acetaminophen (TYLENOL) suppository 650 mg (has no administration in time range)  ondansetron (ZOFRAN) tablet 4 mg (has no administration in time range)    Or  ondansetron (ZOFRAN) injection 4 mg (has no administration in time range)  hydrALAZINE (APRESOLINE) injection 5 mg (has no administration in time range)  acyclovir (ZOVIRAX) 200 MG capsule 400 mg (has no administration in time range)  fluconazole (DIFLUCAN) tablet 100 mg (has no administration in time range)  sodium chloride (OCEAN) 0.65 % nasal spray 1 spray (has no administration in time range)  iohexol (OMNIPAQUE) 350 MG/ML injection 100 mL (100 mLs Intravenous Contrast Given 09/21/18 0008)  sodium chloride 0.9 % bolus 500 mL (0 mLs Intravenous Stopped 09/21/18 0415)    Mobility  walks Moderate fall risk   Focused Assessments Pulmonary Assessment Handoff:  Lung sounds: Bilateral Breath Sounds: Clear L Breath Sounds: Clear R Breath Sounds: Clear O2 Device: Room Air        R Recommendations: See Admitting Provider Note  Report given to:   Additional Notes:  Pt to weak to walk today

## 2018-09-21 NOTE — H&P (Addendum)
History and Physical    HAVANNAH STREAT DGL:[id number] DOB: May 03, 1936 DOA: 09/20/2018  Referring MD/NP/PA:   PCP: Rolene Course, PA-C   Patient coming from:  The patient is coming from home.  At baseline, pt is independent for most of ADL.        Chief Complaint: sinus issue, fatigue, SOB, dark stool, increased urinary frequency  HPI: [name] is a 83 y.o. female with medical history significant of hypertension, hyperlipidemia, GERD, hypothyroidism, thyroid cancer, dCHF, overactive bladder, ulcerative colitis, depression, who presents with a sinus issue, fatigue, SOB, dark stool, increased urinary frequency.  Pt states that she has been generalized weakness for more than 2 months.  She also has shortness of breath on exertion. Her symptoms have been progressively worsening.  She does not have chest pain, cough, fever.  She sometimes has chills.  Patient states that she has sinus issue, with bloody nasal drainage with bright red blood sometimes. Pt was seen by PCP and given prescription of antibiotics, but she did not get refill.  She states that sometimes she noticed dark stool, denies nausea, vomiting or diarrhea.  Occasionally she has right-sided mild abdominal pain, currently no abdominal pain.  She has increased urinary frequency, but no dysuria or burning on urination.  No unilateral numbness or tingling to extremities.  No facial droop or slurred speech. No unexplained weight loss.  Patient was initially suspected to have COVID-19, but test is negative for COVID-19 in ED.  ED Course: pt was found to have severe anemia (Hgb 4.0 w/ MCV 99)<--hgb 14.5 on 12/04/16, thrombocytopenia (plts 3k) and WBC of 3.7k (PMN 7%, lymphocytes 77%). LDH 515k. Cr and LFT's normal, positive urinalysis (hazy appearance, positive nitrite, many bacteria, WBC 6-10), d-dimer 2.59, CRP 9.3, fibrinogen 568, triglyceride 108.  Patient is admitted to stepdown as inpatient.  CT head is negative for acute  intracranial abnormalities. Hematologist, Dr. Maylon Peppers was consulted by EDP.   CTA of chest: 1. No evidence of pulmonary embolus.  No acute pulmonary process. 2. Right lower lobe pulmonary nodule has increased in size from 2009 now measuring up to 15 mm. Given slow growth this probably represents a well-differentiated neoplasm. 3. Small hiatal hernia. 4. Aortic Atherosclerosis (ICD10-I70.0).  CT of maxillofacial image: Mucosal thickening within the axillary and ethmoid sinuses, no bony abnormality is noted.   Review of Systems:   General: no fevers, has chills, no body weight gain, has fatigue HEENT: no blurry vision, hearing changes or sore throat. Has bleeding nasal drainage. Respiratory: has dyspnea, no coughing, wheezing CV: no chest pain, no palpitations GI: no nausea, vomiting, had mild abdominal pain, no diarrhea, constipation.  Has dark stool. GU: no dysuria, burning on urination, has increased urinary frequency, no hematuria  Ext: no leg edema Neuro: no unilateral weakness, numbness, or tingling, no vision change or hearing loss Skin: no rash, no skin tear. MSK: No muscle spasm, no deformity, no limitation of range of movement in spin Heme: No easy bruising.  Travel history: No recent long distant travel.  Allergy:  Allergies  Allergen Reactions   Aspirin Swelling    CAN TOLERATE 81MG    Past Medical History:  Diagnosis Date   Ankle fracture    Colitis    DCM (dilated cardiomyopathy) ([place])    a. previously idiopathic with EF 20% in 2006 s/p ICD with subsequent normalization of EF and removal of defibrillator   Depression    Diastolic dysfunction    Diverticula, colon    Dyslipidemia  Fecal incontinence    mild   GERD (gastroesophageal reflux disease)    Hypertension    Mild aortic insufficiency    Normal coronary arteries 2006   Osteoarthritis    both knees   Overactive bladder    Post-surgical hypothyroidism    Pulmonary nodule     Seasonal allergies    Thyroid nodule    papillary cancer    Past Surgical History:  Procedure Laterality Date   ABDOMINAL HYSTERECTOMY     BROKEN ANKLE     CARDIAC DEFIBRILLATOR PLACEMENT     GENERATOR REMOVAL N/A 07/20/2014   Procedure: ICD GENERATOR REMOVAL;  Surgeon: Evans Lance, MD;  Location: [facility] CATH LAB;  Service: Cardiovascular;  Laterality: N/A;   KNEE SURGERY  01/2002   both   left ankle surgery  08/1999   NASAL SINUS SURGERY     NOSE SURGERY  1987   THYROIDECTOMY  03/29/2009   papillary cancer   TONSILECTOMY, ADENOIDECTOMY, BILATERAL MYRINGOTOMY AND TUBES  1956   TONSILLECTOMY     TORE KNEE     TOTAL ABDOMINAL HYSTERECTOMY W/ BILATERAL SALPINGOOPHORECTOMY  1988    Social History:  reports that she is a non-smoker but has been exposed to tobacco smoke. She has never used smokeless tobacco. She reports current alcohol use. She reports that she does not use drugs.  Family History:  Family History  Problem Relation Age of Onset   Emphysema Father    Hypertension Mother    Alzheimer's disease Mother    Allergy (severe) Mother    Lung cancer Brother    Heart disease Brother    Heart disease Sister    COPD Daughter    Thyroid disease Daughter    Thyroid disease Daughter      Prior to Admission medications   Medication Sig Start Date End Date Taking? Authorizing Provider  acetaminophen (TYLENOL) 500 MG tablet Take 1,000 mg by mouth every 6 (six) hours as needed for moderate pain.   Yes [provider]  amLODipine (NORVASC) 2.5 MG tablet TAKE 1 TABLET DAILY Patient taking differently: Take 2.5 mg by mouth daily.  12/31/17  Yes Dunn, Dayna N, PA-C  aspirin 81 MG tablet Take 81 mg by mouth daily.     Yes [provider]  carvedilol (COREG) 25 MG tablet TAKE 1 TABLET TWICE A DAY WITH MEALS Patient taking differently: Take 25 mg by mouth 2 (two) times daily with a meal.  04/16/17  Yes Turner, Traci R, MD  fish oil-omega-3 fatty  acids 1000 MG capsule Take 2 g by mouth daily.     Yes [provider]  mesalamine (LIALDA) 1.2 g EC tablet Take 1.2 g by mouth daily. 11/15/16  Yes [provider]  montelukast (SINGULAIR) 10 MG tablet Take 1 tablet (10 mg total) by mouth at bedtime. Patient taking differently: Take 10 mg by mouth daily after breakfast.  05/31/17  Yes Parrett, Tammy S, NP  Multiple Vitamin (MULTIVITAMIN) capsule Take 1 capsule by mouth daily.     Yes [provider]  omeprazole (PRILOSEC) 40 MG capsule Take 40 mg by mouth daily. 07/31/18  Yes [provider]  oxybutynin (DITROPAN-XL) 5 MG 24 hr tablet Take 5 mg by mouth daily. 08/19/18  Yes [provider]  ramipril (ALTACE) 10 MG capsule TAKE 1 CAPSULE TWICE A DAY Patient taking differently: Take 10 mg by mouth daily.  04/16/17  Yes Turner, Eber Hong, MD  rosuvastatin (CRESTOR) 5 MG tablet TAKE 1  TABLET DAILY Patient taking differently: Take 5 mg by mouth daily.  03/29/18  Yes Sueanne Margarita, MD  Spacer/Aero Chamber Mouthpiece MISC 1 Device by Does not apply route as directed. 04/03/17  Yes Javier Glazier, MD  SYNTHROID 100 MCG tablet Take 100 mcg by mouth daily before breakfast. 09/15/18  Yes [provider]  temazepam (RESTORIL) 15 MG capsule Take 15 mg by mouth at bedtime as needed for sleep.    Yes [provider]  vitamin C (ASCORBIC ACID) 500 MG tablet Take 500 mg by mouth daily.     Yes [provider]  VOLTAREN 1 % GEL Apply 1 application topically at bedtime as needed (for knee pain).  11/19/14  Yes [provider]    Physical Exam: Vitals:   09/21/18 0231 09/21/18 0300 09/21/18 0330 09/21/18 0400  BP: (!) 131/59 (!) 106/49 (!) 122/50 (!) 124/51  Pulse: 92 94 92 86  Resp: 20 (!) 23 19 15   Temp: 99.2 F (37.3 C)     TempSrc: Oral     SpO2: 99% 100% 100% 100%   General: Not in acute distress. Pale looking. HEENT:       Eyes: PERRL, EOMI, no scleral icterus.        ENT: No discharge from the ears, no pharynx injection, no tonsillar enlargement.        Neck: No JVD, no bruit, no mass felt. Heme: No neck lymph node enlargement. Cardiac: S1/S2, RRR, No murmurs, No gallops or rubs. Respiratory: No rales, wheezing, rhonchi or rubs. GI: Soft, nondistended, nontender, no rebound pain, no organomegaly, BS present. GU: No hematuria Ext: No pitting leg edema bilaterally. 2+DP/PT pulse bilaterally. Musculoskeletal: No joint deformities, No joint redness or warmth, no limitation of ROM in spin. Skin: No rashes.  Neuro: Alert, oriented X3, cranial nerves II-XII grossly intact, moves all extremities normally. Psych: Patient is not psychotic, no suicidal or hemocidal ideation.  Labs on Admission: I have personally reviewed following labs and imaging studies  CBC: Recent Labs  Lab 09/20/18 2207  WBC 3.7*  NEUTROABS 0.3*  HGB 4.0*  HCT 12.6*  MCV 99.2  PLT 3*   Basic Metabolic Panel: Recent Labs  Lab 09/20/18 2207  NA 137  K 5.1  CL 109  CO2 21*  GLUCOSE 151*  BUN 33*  CREATININE 0.95  CALCIUM 8.7*   GFR: CrCl cannot be calculated (Unknown ideal weight.). Liver Function Tests: Recent Labs  Lab 09/20/18 2207  AST 18  ALT 10  ALKPHOS 51  BILITOT 0.6  PROT 6.4*  ALBUMIN 3.1*   No results for input(s): LIPASE, AMYLASE in the last 168 hours. No results for input(s): AMMONIA in the last 168 hours. Coagulation Profile: No results for input(s): INR, PROTIME in the last 168 hours. Cardiac Enzymes: Recent Labs  Lab 09/20/18 2311  TROPONINI <0.03   BNP (last 3 results) No results for input(s): PROBNP in the last 8760 hours. HbA1C: No results for input(s): HGBA1C in the last 72 hours. CBG: No results for input(s): GLUCAP in the last 168 hours. Lipid Profile: Recent Labs    09/20/18 2207  TRIG 108   Thyroid Function Tests: No results for input(s): TSH, T4TOTAL, FREET4, T3FREE, THYROIDAB in the last 72 hours. Anemia Panel: Recent  Labs    09/20/18 2207 09/20/18 2335  VITAMINB12  --  784  FOLATE  --  22.0  FERRITIN 625*  --   TIBC  --  299  IRON  --  184*  RETICCTPCT  --  0.3*   Urine analysis:    Component Value Date/Time   COLORURINE YELLOW 09/20/2018 2224   APPEARANCEUR HAZY (A) 09/20/2018 2224   LABSPEC 1.015 09/20/2018 2224   PHURINE 6.0 09/20/2018 2224   GLUCOSEU NEGATIVE 09/20/2018 2224   HGBUR SMALL (A) 09/20/2018 2224   BILIRUBINUR NEGATIVE 09/20/2018 2224   KETONESUR NEGATIVE 09/20/2018 2224   PROTEINUR NEGATIVE 09/20/2018 2224   UROBILINOGEN 0.2 03/24/2009 1208   NITRITE POSITIVE (A) 09/20/2018 2224   LEUKOCYTESUR NEGATIVE 09/20/2018 2224   Sepsis Labs: @LABRCNTIP (procalcitonin:4,lacticidven:4) ) Recent Results (from the past 240 hour(s))  SARS Coronavirus 2 [facility] order, Performed in [facility] hospital lab)     Status: None   Collection Time: 09/20/18 10:07 PM  Result Value Ref Range Status   SARS Coronavirus 2 NEGATIVE NEGATIVE Final    Comment: (NOTE) If result is NEGATIVE SARS-CoV-2 target nucleic acids are NOT DETECTED. The SARS-CoV-2 RNA is generally detectable in upper and lower  respiratory specimens during the acute phase of infection. The lowest  concentration of SARS-CoV-2 viral copies this assay can detect is 250  copies / mL. A negative result does not preclude SARS-CoV-2 infection  and should not be used as the sole basis for treatment or other  patient management decisions.  A negative result may occur with  improper specimen collection / handling, submission of specimen other  than nasopharyngeal swab, presence of viral mutation(s) within the  areas targeted by this assay, and inadequate number of viral copies  (<250 copies / mL). A negative result must be combined with clinical  observations, patient history, and epidemiological information. If result is POSITIVE SARS-CoV-2 target nucleic acids are DETECTED. The SARS-CoV-2 RNA is generally detectable in upper  and lower  respiratory specimens dur ing the acute phase of infection.  Positive  results are indicative of active infection with SARS-CoV-2.  Clinical  correlation with patient history and other diagnostic information is  necessary to determine patient infection status.  Positive results do  not rule out bacterial infection or co-infection with other viruses. If result is PRESUMPTIVE POSTIVE SARS-CoV-2 nucleic acids MAY BE PRESENT.   A presumptive positive result was obtained on the submitted specimen  and confirmed on repeat testing.  While 2019 novel coronavirus  (SARS-CoV-2) nucleic acids may be present in the submitted sample  additional confirmatory testing may be necessary for epidemiological  and / or clinical management purposes  to differentiate between  SARS-CoV-2 and other Sarbecovirus currently known to infect humans.  If clinically indicated additional testing with an alternate test  methodology [phone]) is advised. The SARS-CoV-2 RNA is generally  detectable in upper and lower respiratory sp ecimens during the acute  phase of infection. The expected result is Negative. Fact Sheet for Patients:  StrictlyIdeas.no Fact Sheet for Healthcare Providers: BankingDealers.co.za This test is not yet approved or cleared by the [place] FDA and has been authorized for detection and/or diagnosis of SARS-CoV-2 by FDA under an Emergency Use Authorization (EUA).  This EUA will remain in effect (meaning this test can be used) for the duration of the COVID-19 declaration under Section 564(b)(1) of the Act, 21 U.S.C. section 360bbb-3(b)(1), unless the authorization is terminated or revoked sooner. Performed at [facility], [place] [address] [place], [place] [address]      Radiological Exams on Admission: Ct Head Wo Contrast  Result Date: 09/21/2018 CLINICAL DATA:  Vision changes EXAM: CT HEAD WITHOUT CONTRAST CT  MAXILLOFACIAL WITHOUT CONTRAST TECHNIQUE: Multidetector  CT imaging of the head and maxillofacial structures were performed using the standard protocol without intravenous contrast. Multiplanar CT image reconstructions of the maxillofacial structures were also generated. COMPARISON:  None. FINDINGS: CT HEAD FINDINGS Brain: Mild atrophic changes are noted. No findings to suggest acute hemorrhage, acute infarction or space-occupying mass lesion are noted. Vascular: No hyperdense vessel or unexpected calcification. Skull: Normal. Negative for fracture or focal lesion. Other: None. CT MAXILLOFACIAL FINDINGS Osseous: No acute fracture is noted. Chronic nasal bone fractures are noted stable from 2010. Orbits: Orbits and their contents are within normal limits. Sinuses: Paranasal sinuses demonstrate minimal mucosal thickening within the maxillary and ethmoid sinuses. No air-fluid levels are noted. Soft tissues: Negative. IMPRESSION: CT of the head: Mild atrophic changes. No acute intracranial abnormality noted. CT of the maxillofacial bones: Mucosal thickening within the axillary and ethmoid sinuses No bony abnormality is noted. Electronically Signed   By: Inez Catalina M.D.   On: 09/21/2018 00:07   Ct Angio Chest Pe W And/or Wo Contrast  Result Date: 09/21/2018 CLINICAL DATA:  83 y/o  F; dyspnea, positive D-dimer. EXAM: CT ANGIOGRAPHY CHEST WITH CONTRAST TECHNIQUE: Multidetector CT imaging of the chest was performed using the standard protocol during bolus administration of intravenous contrast. Multiplanar CT image reconstructions and MIPs were obtained to evaluate the vascular anatomy. CONTRAST:  187m OMNIPAQUE IOHEXOL 350 MG/ML SOLN COMPARISON:  02/13/2008 CT chest. 5129 PET-CT. FINDINGS: Cardiovascular: Satisfactory opacification of the pulmonary arteries to the segmental level. No evidence of pulmonary embolism. Mild cardiomegaly, aortic atherosclerosis, and coronary artery calcific atherosclerosis. No pericardial  effusion. Mediastinum/Nodes: No enlarged mediastinal, hilar, or axillary lymph nodes. Thyroidectomy. Normal trachea and esophagus. Lungs/Pleura: Right lower lobe pulmonary nodule has increased in size from 2009 now measuring 15 x 11 mm (AP by ML series 6, image 88). No consolidation, effusion, pneumothorax, or findings of edema. Upper Abdomen: Small hiatal hernia. Musculoskeletal: Right anterior 7 rib subacute fracture with callus. Advanced degenerative changes of the glenohumeral joints. No acute osseous abnormality. Review of the MIP images confirms the above findings. IMPRESSION: 1. No evidence of pulmonary embolus.  No acute pulmonary process. 2. Right lower lobe pulmonary nodule has increased in size from 2009 now measuring up to 15 mm. Given slow growth this probably represents a well-differentiated neoplasm. Consider continued surveillance with 3 month CT chest, PET scan, and/or possibly tissue sampling. 3. Small hiatal hernia. 4. Aortic Atherosclerosis (ICD10-I70.0). Electronically Signed   By: LKristine GarbeM.D.   On: 09/21/2018 01:29   Dg Chest Port 1 View  Result Date: 09/20/2018 CLINICAL DATA:  83y/o F; shortness of breath and right-sided chest wall pain with movement. EXAM: PORTABLE CHEST 1 VIEW COMPARISON:  08/05/2018 chest radiograph FINDINGS: Stable mildly enlarged cardiac silhouette given projection and technique. Single lead AICD wire noted. No pacing device. Aortic atherosclerosis with calcification. Clear lungs. No pleural effusion or pneumothorax. Severe osteoarthrosis of the left glenohumeral joint. No acute osseous abnormality identified. IMPRESSION: No acute pulmonary process identified. Electronically Signed   By: LKristine GarbeM.D.   On: 09/20/2018 22:52   Ct Maxillofacial Wo Contrast  Result Date: 09/21/2018 CLINICAL DATA:  Vision changes EXAM: CT HEAD WITHOUT CONTRAST CT MAXILLOFACIAL WITHOUT CONTRAST TECHNIQUE: Multidetector CT imaging of the head and  maxillofacial structures were performed using the standard protocol without intravenous contrast. Multiplanar CT image reconstructions of the maxillofacial structures were also generated. COMPARISON:  None. FINDINGS: CT HEAD FINDINGS Brain: Mild atrophic changes are noted. No findings to suggest acute hemorrhage,  acute infarction or space-occupying mass lesion are noted. Vascular: No hyperdense vessel or unexpected calcification. Skull: Normal. Negative for fracture or focal lesion. Other: None. CT MAXILLOFACIAL FINDINGS Osseous: No acute fracture is noted. Chronic nasal bone fractures are noted stable from 2010. Orbits: Orbits and their contents are within normal limits. Sinuses: Paranasal sinuses demonstrate minimal mucosal thickening within the maxillary and ethmoid sinuses. No air-fluid levels are noted. Soft tissues: Negative. IMPRESSION: CT of the head: Mild atrophic changes. No acute intracranial abnormality noted. CT of the maxillofacial bones: Mucosal thickening within the axillary and ethmoid sinuses No bony abnormality is noted. Electronically Signed   By: Inez Catalina M.D.   On: 09/21/2018 00:07     EKG: Independently reviewed.  Sinus rhythm, QTC 497, old left bundle blockade, anteroseptal infarction pattern.   Assessment/Plan Principal Problem:   Pancytopenia ([place]) Active Problems:   Essential hypertension   GERD (gastroesophageal reflux disease)   Dyslipidemia   Ulcerative colitis ([facility])   Symptomatic anemia   GI bleed   Lung nodule   Hypothyroidism   Chronic diastolic CHF (congestive heart failure) ([facility])   UTI (urinary tract infection)   Sinusitis   Pancytopenia and symptomatic anemia: Dr. Maylon Peppers of hematology was consulted. Per Dr. Maylon Peppers, patient's peripheral blood smear showed scattered large atypical appearing WBC's with large nuclei and loose chromatin, concerning for blasts, no significant schistocytosis, and thrombotic microangiopathy is unlikely, ITP also is unlikely,  suspecting hypoproliferative leukemia.   -will admit to SDU as inpt -highly appreciate Zhao's consultation, will follow up recommendations as follows:  -For severe anemia, 2 units RBC's have been ordered and I recommend keeping Hgb > 7-->I changed to transfuse 3 U of blood.  -For severe thrombocytopenia, 1 unit platelet has been ordered, and I recommended keeping plts > 10k in the absence of clinically significant bleeding  -Please transfuse IRRADIATED BLOOD PRODUCTS ONLY  -I recommend repeating CBC after transfusing RBC and plts to assess response  -I have also ordered full coagulation panel, uric acid, HIV, hepatitis B/C serologies, and peripheral blood flow cytometry   -I discussed the case with the on-call hematologist, Dr. Norma Fredrickson, at [facility], who agreed with supportive care overnight, and re-assess her status for possible transfer to [facility] for further evaluation; in addition, he recommends starting prophylactic 565m daily, acyclovir 4084mBID, and fluconazole 10040maily for neutropenia.--> Rocephin was started for possible UTI (see below) -will check cbc q6h due to GIB -INR/PTT/type & screen -f/u LE venous doppler to r/o DVT due to positive D-dimer  RLL nodule: per Dr. ZhaMaylon Peppersgiven the slow growth since 2009, this may represent a benign granuloma vs. low-grade neoplasm, and in light of suspicion for leukemia, we will hold off further work-up for now" -f/u further [facility]: pt has dark stool and positive FOBT.  Currently does not have active rectal bleeding.  Pt seems to have slow GIB, which is likely due to thrombocytopenia. Pt has ulcerative colitis, which may have contributed partially. Pt states that [facility] and had [facility] removed (I cannot find report in Epic).  She does not think she had EGD in the past. - transfuse 3 units of blood as above - hold ASA - NPO - IVF: 500 ml of NS  - Start IV  pantoprazole 40 mg bid - Zofran IV for nausea - Avoid NSAIDs and SQ heparin - Maintain IV access (2 large bore IVs if possible). - Monitor closely  and follow q6h cbc, transfuse as necessary, if Hgb<7.0 - may need to consult GI in AM  HTN:  -Continue home medications: coreg -hold amlodipine and ramipril due to severe anemia -IV hydralazine prn  GERD (gastroesophageal reflux disease): -on protonix IV as above  Dyslipidemia: -crestor  Ulcerative colitis ([place]): -continue mesalamine  Hypothyroidism: -continue Synthroid  Chronic diastolic CHF (congestive heart failure) ([place]): per Dr. Lorette Ang note, "patient has a remote hx of dilated cardiomyopathy in 2006, thought to be due to viral infection. She had ICD placed, but over time, her cardiac function normalized and the ICD was removed. Her most recent LVEF was 60-65% in 2018".   UTI (urinary tract infection): pt has increased urinary frequency, urinalysis shows possible UTI -IV Rocephin Follow-up urine culture and blood culture  Possible sinusitis: -on Rocephin which is for UTI and should cover -prn saline nasal spray   Inpatient status:  # Patient requires inpatient status due to high intensity of service, high risk for further deterioration and high frequency of surveillance required.  I certify that at the point of admission it is my clinical judgment that the patient will require inpatient hospital care spanning beyond 2 midnights from the point of admission.   This patient has multiple chronic comorbidities including hypertension, hyperlipidemia, GERD, hypothyroidism, thyroid cancer, dCHF, overactive bladder, ulcerative colitis, depression.  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  The worrisome physical exam findings include pale looking  The initial radiographic and laboratory data are worrisome because of pancytopenia,  elevated LDH, positive d-dimer, positive urinalysis, positive FOBT, right lower lobe lung nodule on CT scan.  Current medical needs: please see my assessment and plan  Predictability of an adverse outcome (risk): Patient has multiple comorbidities, now presents with multiple acute issues, including pancytopenia, concerning for hypoproliferative leukemia, GI bleeding, possible UTI and sinusitis.  Patient's presentation is highly complicated, at high risk of deteriorating.  Patient will need to be treated in hospital for at least 2 days.   DVT ppx: none (due to pancytopenia and a GI bleeding, cannot use heparin or Lovenox; pending lower extremity Doppler to rule out DVT, cannot start SCD at this moment).  If lower extremity Dopplers negative for DVT, may need to start SCD Code Status: Full code Family Communication: None at bed side.     Disposition Plan:  Anticipate discharge back to previous home environment Consults called:  Dr. Maylon Peppers of pathology Admission status: SDU/inpation       Date of Service 09/21/2018    [facility]   If 7PM-7AM, please contact night-coverage [email or web address] Password [facility] 09/21/2018, 4:16 AM

## 2018-09-21 NOTE — ED Notes (Signed)
Spoke with provider - post pone HIV PTT to later time .  Getting blood currently

## 2018-09-21 NOTE — Progress Notes (Signed)
LE venous duplex       has been completed. Preliminary results can be found under CV proc through chart review. Jerni Selmer, BS, RDMS, RVT   

## 2018-09-21 NOTE — Consult Note (Signed)
[facility] CONSULT NOTE  Patient Care Team: Heather Course, PA-C as PCP - General (Internal Medicine) Sueanne Margarita, MD as PCP - Cardiology (Cardiology)  HEME/ONC OVERVIEW: 1. New onset pancytopenia -09/2018: labs in ER notable for WBC 3.7k (7% PMN, 77% lymphocytes), Hgb 4 and plts 3k; LDH 515; Tbili and Cr normal  2. 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, margins negative -05/2009: RAI ablation; post treatment I131 scan negative     ASSESSMENT & PLAN:   New onset pancytopenia  -Most recent labs in the EMR were from 12/2016, at which time CBC was completely normal; no other labs available for comparison -I personally reviewed the patient's peripheral blood smear, which showed scattered large atypical appearing WBC's with large nuclei and loose chromatin, concerning for blasts; there were very scant mature platelets and occasional giant platelets; there was no significant schistocytosis  -Given the normal renal function, Tbili and absence of schistocytes on the peripheral blood smear, thrombotic microangiopathy is unlikely; ITP also unlikely, given the concurrent severe anemia and elevated LDH  -In light of the severe anemia, thrombocytopenia, elevated LDH, presence of abnormal WBC's suspicious for blasts, and the patient's history of RAI (which can increase the risk of secondary malignancies including leukemia), this is very 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 Norma Fredrickson, at [facility], who agreed with supportive care overnight, and re-assess her status for possible transfer to [facility] for further evaluation; in addition, he recommends starting prophylactic 500mg  daily, acyclovir 400mg  BID, and fluconazole 100mg  daily for neutropenia   RLL nodule -I independently reviewed the radiologic images of CTA chest, and agree with findings as documented; in summary, CTA chest in the ER negative for PTE, but did demonstrate that a slightly enlarging RLL nodule 1.5 x 1.1cm (comparing to 2009) -Given the slow growth since 2009, this may represent a benign granuloma vs. Low-grade neoplasm -In light of suspicion for leukemia, we will hold off further work-up for now  Thank you for the opportunity to participate in [name]'s care. Please do not hesitate to contact me if there are any questions.   Tish Men, MD 09/21/2018 1:30 AM   CHIEF COMPLAINTS/PURPOSE OF CONSULTATION:  "I am just really tired "  HISTORY OF PRESENTING ILLNESS:  [name] 83 y.o. female is here because of progressive dypsnea and was found with pancytopenia, in particular severe anemia and thrombocytopenia. Patient reports that over the past 2-3 months, she has had progressive dyspnea. She lives by herself and is usually independent with her ADL's, but the dyspnea has become progressively worse to the point that she can only walk a few feet before stopping due to shortness of breath. She also reports very prominent fatigue just even with standing for one to two minutes. She has noticed some scattered bruises and petechiae over the upper and lower extremities over the past a few weeks.  She reports constant chills, but denies any fever, night sweats, unexplained weight loss or lymphadenopathy.  Starting this week, she has had bloody nasal drainage with bright red blood, which she attributed to a "sinus infection". She was prescribed an abx, but has not picked up the prescription due to fatigue and dyspnea.  Due to progressive dyspnea, she presented to [facility] ER for further evaluation. CBC was notable for new severe anemia (Hgb 4.0 w/ MCV 99), thrombocytopenia (plts 3k) and WBC of 3.7k (PMN 7%, lymphocytes 77%). LDH was elevated at 515k. Cr and LFT's were normal.   Of note, patient has a remote hx of dilated cardiomyopathy in 2006, thought to be due to viral infection. She had ICD placed, but over time, her cardiac function normalized and the ICD was removed. Her most recent LVEF was 60-65% in 2018.   MEDICAL HISTORY:  Past Medical History:  Diagnosis Date  . Ankle fracture   . Colitis   . DCM (dilated cardiomyopathy) ([place])    a. previously idiopathic with EF 20% in 2006 s/p ICD with subsequent normalization of EF and removal of defibrillator  . Depression   . Diastolic dysfunction   . Diverticula, colon   . Dyslipidemia   . Fecal incontinence    mild  . GERD (gastroesophageal reflux disease)   . Hypertension   . Mild aortic insufficiency   . Normal coronary arteries 2006  . Osteoarthritis    both knees  . Overactive bladder   . Post-surgical hypothyroidism   . Pulmonary nodule   . Seasonal allergies   . Thyroid nodule    papillary cancer    SURGICAL HISTORY: Past Surgical History:  Procedure Laterality Date  . ABDOMINAL HYSTERECTOMY    . BROKEN ANKLE    . CARDIAC DEFIBRILLATOR PLACEMENT    . GENERATOR REMOVAL N/A 07/20/2014   Procedure: ICD GENERATOR REMOVAL;  Surgeon: Evans Lance, MD;  Location: [facility] CATH LAB;  Service: Cardiovascular;  Laterality: N/A;  . KNEE SURGERY  01/2002   both  . left ankle surgery  08/1999  . NASAL SINUS SURGERY    . NOSE SURGERY  1987  . THYROIDECTOMY  03/29/2009   papillary cancer  . TONSILECTOMY, ADENOIDECTOMY, BILATERAL MYRINGOTOMY  AND TUBES  1956  . TONSILLECTOMY    . TORE KNEE    . TOTAL ABDOMINAL HYSTERECTOMY W/ BILATERAL SALPINGOOPHORECTOMY  1988    SOCIAL HISTORY: Social History   Socioeconomic History  . Marital status: Divorced    Spouse name: Not on file  . Number of children: Not on file  . Years of education: Not on file  . Highest education level: Not on file  Occupational History  . Not on file  Social Needs  . Financial resource strain: Not on file  . Food insecurity:    Worry: Not on file    Inability: Not on file  . Transportation needs:    Medical: Not on file    Non-medical: Not on file  Tobacco Use  . Smoking status: Passive Smoke Exposure - Never Smoker  . Smokeless tobacco: Never Used  . Tobacco comment: Parents, siblings, and ex-husband smoked around her.  Substance and Sexual Activity  . Alcohol use: Yes    Comment: rare  . Drug use: No  . Sexual activity: Not on file  Lifestyle  . Physical activity:    Days per week: Not on file    Minutes per session: Not on file  . Stress: Not on file  Relationships  .  Social connections:    Talks on phone: Not on file    Gets together: Not on file    Attends religious service: Not on file    Active member of club or organization: Not on file    Attends meetings of clubs or organizations: Not on file    Relationship status: Not on file  . Intimate partner violence:    Fear of current or ex partner: Not on file    Emotionally abused: Not on file    Physically abused: Not on file    Forced sexual activity: Not on file  Other Topics Concern  . Not on file  Social History Narrative   [facility] (01/31/17):   Originally from [place]. She lived in [place] most of her life. Also lived in [place] and outside [place], [place]. She lived in [place] for 1 year as a child. Moved to [place] in 2001. 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 Previously enjoyed gardening and sewing. Has 2 dogs & 2 cats currently.  Remote parakeets as a child. No mold or hot tub exposure.     FAMILY HISTORY: Family History  Problem Relation Age of Onset  . Emphysema Father   . Hypertension Mother   . Alzheimer's disease Mother   . Allergy (severe) Mother   . Lung cancer Brother   . Heart disease Brother   . Heart disease Sister   . COPD Daughter   . Thyroid disease Daughter   . Thyroid disease Daughter     ALLERGIES:  is allergic to aspirin.  MEDICATIONS:  Current Facility-Administered Medications  Medication Dose Route Frequency Provider Last Rate Last Dose  . 0.9 %  sodium chloride infusion (Manually program via Guardrails IV Fluids)   Intravenous Once Rancour, Stephen, MD      . dexamethasone (DECADRON) tablet 40 mg  40 mg Oral Once Rancour, Stephen, MD      . sodium chloride (PF) 0.9 % injection           . sodium chloride 0.9 % bolus 500 mL  500 mL Intravenous Once Rancour, Annie Main, MD       Current Outpatient Medications  Medication Sig Dispense Refill  . acetaminophen (TYLENOL) 500 MG tablet Take 1,000 mg by mouth every 6 (six) hours as needed for moderate pain.    Marland Kitchen aspirin 81 MG tablet Take 81 mg by mouth daily.      . carvedilol (COREG) 25 MG tablet TAKE 1 TABLET TWICE A DAY WITH MEALS (Patient taking differently: Take 25 mg by mouth 2 (two) times daily with a meal. ) 180 tablet 2  . fish oil-omega-3 fatty acids 1000 MG capsule Take 2 g by mouth daily.      . mesalamine (LIALDA) 1.2 g EC tablet Take 1.2 g by mouth daily.    . montelukast (SINGULAIR) 10 MG tablet Take 1 tablet (10 mg total) by mouth at bedtime. 90 tablet 1  . Multiple Vitamin (MULTIVITAMIN) capsule Take 1 capsule by mouth daily.      Marland Kitchen omeprazole (PRILOSEC) 40 MG capsule Take 40 mg by mouth daily.    Marland Kitchen oxybutynin (DITROPAN-XL) 5 MG 24 hr tablet Take 5 mg by mouth daily.    . ramipril (ALTACE) 10 MG capsule TAKE 1 CAPSULE TWICE A DAY (Patient taking differently: Take 10 mg by mouth daily. ) 180 capsule 2  . rosuvastatin (CRESTOR)  5 MG tablet TAKE 1 TABLET DAILY (Patient taking  differently: Take 5 mg by mouth daily. ) 90 tablet 1  . Spacer/Aero Chamber Mouthpiece MISC 1 Device by Does not apply route as directed. 1 each 0  . SYNTHROID 100 MCG tablet Take 100 mcg by mouth daily before breakfast.    . temazepam (RESTORIL) 15 MG capsule Take 15 mg by mouth at bedtime as needed for sleep.     . vitamin C (ASCORBIC ACID) 500 MG tablet Take 500 mg by mouth daily.      . VOLTAREN 1 % GEL Apply 1 application topically at bedtime as needed (for knee pain).     Marland Kitchen amLODipine (NORVASC) 2.5 MG tablet TAKE 1 TABLET DAILY 90 tablet 2    REVIEW OF SYSTEMS:   Constitutional: ( - ) fevers, ( + )  chills , ( - ) night sweats Eyes: ( - ) blurriness of vision, ( - ) double vision, ( - ) watery eyes Ears, nose, mouth, throat, and face: ( - ) mucositis, ( - ) sore throat Respiratory: ( - ) cough, ( + ) dyspnea, ( - ) wheezes Cardiovascular: ( - ) palpitation, ( - ) chest discomfort, ( - ) lower extremity swelling Gastrointestinal:  ( - ) nausea, ( - ) heartburn, ( - ) change in bowel habits Skin: ( + ) abnormal skin rashes Lymphatics: ( - ) new lymphadenopathy, ( + ) easy bruising Neurological: ( - ) numbness, ( - ) tingling, ( - ) new weaknesses Behavioral/Psych: ( - ) mood change, ( - ) new changes  All other systems were reviewed with the patient and are negative.  PHYSICAL EXAMINATION: ECOG PERFORMANCE STATUS: 2 - Symptomatic, <50% confined to bed  Vitals:   09/20/18 2344 09/20/18 2349  BP: (!) 125/42 (!) 111/45  Pulse: 87   Resp: 19   Temp: 99.7 F (37.6 C)   SpO2: 97%    There were no vitals filed for this visit.  GENERAL: alert, no distress, pale  SKIN: scattered petechiae over the hands and bilateral lower extremities  EYES: conjunctiva are pink and non-injected, sclera clear OROPHARYNX: no exudate, no erythema; lips, buccal mucosa, and tongue normal  NECK: supple, non-tender LYMPH:  no palpable lymphadenopathy in  the cervical LUNGS: clear to auscultation with normal breathing effort HEART: regular rate & rhythm, no murmurs, trace bilateral lower extremity edema ABDOMEN: soft, non-tender, non-distended, normal bowel sounds Musculoskeletal: no cyanosis of digits and no clubbing  PSYCH: alert & oriented x 3, fluent speech NEURO: no focal motor/sensory deficits  LABORATORY DATA:  I have reviewed the data as listed Lab Results  Component Value Date   WBC 3.7 (L) 09/20/2018   HGB 4.0 (LL) 09/20/2018   HCT 12.6 (L) 09/20/2018   MCV 99.2 09/20/2018   PLT 3 (LL) 09/20/2018   Lab Results  Component Value Date   NA 137 09/20/2018   K 5.1 09/20/2018   CL 109 09/20/2018   CO2 21 (L) 09/20/2018    RADIOGRAPHIC STUDIES: I have personally reviewed the radiological images as listed and agreed with the findings in the report. Ct Head Wo Contrast  Result Date: 09/21/2018 CLINICAL DATA:  Vision changes EXAM: CT HEAD WITHOUT CONTRAST CT MAXILLOFACIAL WITHOUT CONTRAST TECHNIQUE: Multidetector CT imaging of the head and maxillofacial structures were performed using the standard protocol without intravenous contrast. Multiplanar CT image reconstructions of the maxillofacial structures were also generated. COMPARISON:  None. FINDINGS: CT HEAD FINDINGS Brain: Mild atrophic changes are noted. No findings to suggest acute hemorrhage, acute  infarction or space-occupying mass lesion are noted. Vascular: No hyperdense vessel or unexpected calcification. Skull: Normal. Negative for fracture or focal lesion. Other: None. CT MAXILLOFACIAL FINDINGS Osseous: No acute fracture is noted. Chronic nasal bone fractures are noted stable from 2010. Orbits: Orbits and their contents are within normal limits. Sinuses: Paranasal sinuses demonstrate minimal mucosal thickening within the maxillary and ethmoid sinuses. No air-fluid levels are noted. Soft tissues: Negative. IMPRESSION: CT of the head: Mild atrophic changes. No acute  intracranial abnormality noted. CT of the maxillofacial bones: Mucosal thickening within the axillary and ethmoid sinuses No bony abnormality is noted. Electronically Signed   By: Inez Catalina M.D.   On: 09/21/2018 00:07   Dg Chest Port 1 View  Result Date: 09/20/2018 CLINICAL DATA:  83 y/o F; shortness of breath and right-sided chest wall pain with movement. EXAM: PORTABLE CHEST 1 VIEW COMPARISON:  08/05/2018 chest radiograph FINDINGS: Stable mildly enlarged cardiac silhouette given projection and technique. Single lead AICD wire noted. No pacing device. Aortic atherosclerosis with calcification. Clear lungs. No pleural effusion or pneumothorax. Severe osteoarthrosis of the left glenohumeral joint. No acute osseous abnormality identified. IMPRESSION: No acute pulmonary process identified. Electronically Signed   By: Kristine Garbe M.D.   On: 09/20/2018 22:52   Ct Maxillofacial Wo Contrast  Result Date: 09/21/2018 CLINICAL DATA:  Vision changes EXAM: CT HEAD WITHOUT CONTRAST CT MAXILLOFACIAL WITHOUT CONTRAST TECHNIQUE: Multidetector CT imaging of the head and maxillofacial structures were performed using the standard protocol without intravenous contrast. Multiplanar CT image reconstructions of the maxillofacial structures were also generated. COMPARISON:  None. FINDINGS: CT HEAD FINDINGS Brain: Mild atrophic changes are noted. No findings to suggest acute hemorrhage, acute infarction or space-occupying mass lesion are noted. Vascular: No hyperdense vessel or unexpected calcification. Skull: Normal. Negative for fracture or focal lesion. Other: None. CT MAXILLOFACIAL FINDINGS Osseous: No acute fracture is noted. Chronic nasal bone fractures are noted stable from 2010. Orbits: Orbits and their contents are within normal limits. Sinuses: Paranasal sinuses demonstrate minimal mucosal thickening within the maxillary and ethmoid sinuses. No air-fluid levels are noted. Soft tissues: Negative. IMPRESSION:  CT of the head: Mild atrophic changes. No acute intracranial abnormality noted. CT of the maxillofacial bones: Mucosal thickening within the axillary and ethmoid sinuses No bony abnormality is noted. Electronically Signed   By: Inez Catalina M.D.   On: 09/21/2018 00:07    PATHOLOGY: I have reviewed the pathology reports as documented in the oncologist history.

## 2018-09-22 LAB — HEPATITIS B SURFACE ANTIGEN: Hepatitis B Surface Ag: NEGATIVE

## 2018-09-22 LAB — TYPE AND SCREEN
ABO/RH(D): B POS
Antibody Screen: NEGATIVE
Unit division: 0
Unit division: 0

## 2018-09-22 LAB — BPAM RBC
Blood Product Expiration Date: 202005062359
Blood Product Expiration Date: 202005082359
ISSUE DATE / TIME: 202004180150
ISSUE DATE / TIME: 202004180614
Unit Type and Rh: 7300
Unit Type and Rh: 7300

## 2018-09-22 LAB — HEPATITIS B CORE ANTIBODY, TOTAL: Hep B Core Total Ab: NEGATIVE

## 2018-09-22 LAB — HEPATITIS C ANTIBODY: HCV Ab: <0.1 s/co ratio (ref 0.0–0.9)

## 2018-09-22 LAB — HEPATITIS B SURFACE ANTIBODY,QUALITATIVE: Hep B S Ab: NONREACTIVE

## 2018-09-22 MED ORDER — OXYBUTYNIN CHLORIDE ER 5 MG PO TB24
5.00 | ORAL_TABLET | ORAL | Status: DC
Start: 2018-10-16 — End: 2018-09-22

## 2018-09-22 MED ORDER — LEVOTHYROXINE SODIUM 50 MCG PO TABS
100.00 | ORAL_TABLET | ORAL | Status: DC
Start: 2018-10-16 — End: 2018-09-22

## 2018-09-22 MED ORDER — SODIUM CHLORIDE 0.9 % IV SOLN
250.00 | INTRAVENOUS | Status: DC
Start: ? — End: 2018-09-22

## 2018-09-23 LAB — URINE CULTURE: Culture: >=100000 — AB

## 2018-09-23 LAB — PATHOLOGIST SMEAR REVIEW

## 2018-09-24 MED ORDER — AMLODIPINE BESYLATE 5 MG PO TABS
5.00 | ORAL_TABLET | ORAL | Status: DC
Start: 2018-09-25 — End: 2018-09-24

## 2018-09-24 MED ORDER — DASATINIB 140 MG PO TABS
140.00 | ORAL_TABLET | ORAL | Status: DC
Start: 2018-10-15 — End: 2018-09-24

## 2018-09-24 MED ORDER — ONDANSETRON HCL 8 MG PO TABS
8.00 | ORAL_TABLET | ORAL | Status: DC
Start: ? — End: 2018-09-24

## 2018-09-24 MED ORDER — CARVEDILOL 12.5 MG PO TABS
25.00 | ORAL_TABLET | ORAL | Status: DC
Start: 2018-10-15 — End: 2018-09-24

## 2018-09-24 MED ORDER — DEXAMETHASONE 4 MG PO TABS
20.00 | ORAL_TABLET | ORAL | Status: DC
Start: 2018-09-25 — End: 2018-09-24

## 2018-09-26 LAB — CULTURE, BLOOD (ROUTINE X 2)
Culture: NO GROWTH
Culture: NO GROWTH
Special Requests: ADEQUATE

## 2018-10-04 ENCOUNTER — Telehealth: Payer: Self-pay

## 2018-10-04 NOTE — Telephone Encounter (Signed)
Called pt to set up possible evisit, left message asking her to call the office. If pt returns call please get a number and a time where they can be reached. Thank you.

## 2018-10-09 NOTE — Telephone Encounter (Signed)
Pt is currently at [facility] for cancer treatments and she will be there for a few weeks. Pt wants [place] to schedule her appointment for a later date.

## 2018-10-15 ENCOUNTER — Other Ambulatory Visit: Payer: Self-pay

## 2018-10-15 ENCOUNTER — Ambulatory Visit: Payer: Medicare Other | Admitting: Physician Assistant

## 2018-10-15 MED ORDER — DEXTROSE 10 % IV SOLN
125.00 | INTRAVENOUS | Status: DC
Start: ? — End: 2018-10-15

## 2018-10-15 MED ORDER — REFRESH LACRI-LUBE OP OINT
TOPICAL_OINTMENT | OPHTHALMIC | Status: DC
Start: 2018-10-15 — End: 2018-10-15

## 2018-10-15 MED ORDER — AMLODIPINE BESYLATE 2.5 MG PO TABS
2.50 | ORAL_TABLET | ORAL | Status: DC
Start: 2018-10-16 — End: 2018-10-15

## 2018-10-15 MED ORDER — INSULIN LISPRO 100 UNIT/ML ~~LOC~~ SOLN
1.00 | SUBCUTANEOUS | Status: DC
Start: 2018-10-15 — End: 2018-10-15

## 2018-10-15 MED ORDER — GLUCOSE 40 % PO GEL
15.00 | ORAL | Status: DC
Start: ? — End: 2018-10-15

## 2018-10-15 MED ORDER — CARBOXYMETHYLCELLULOSE SODIUM 0.5 % OP SOLN
1.00 | OPHTHALMIC | Status: DC
Start: 2018-10-15 — End: 2018-10-15

## 2018-10-15 NOTE — Telephone Encounter (Signed)
Spoke to patient. Patient currently at [facility] receiving chemo treatments and wishes to call back and reschedule appointment. Appointment cancelled and recall placed.

## 2018-10-18 LAB — PREPARE PLATELET PHERESIS
Unit division: 0
Unit division: 0

## 2018-10-18 LAB — BPAM PLATELET PHERESIS
Blood Product Expiration Date: 202004202359
Blood Product Expiration Date: 202004202359
ISSUE DATE / TIME: 202004180945
ISSUE DATE / TIME: 202004181317
Unit Type and Rh: 7300
Unit Type and Rh: 8400

## 2018-10-22 DIAGNOSIS — D65 Disseminated intravascular coagulation [defibrination syndrome]: Secondary | ICD-10-CM | POA: Diagnosis not present

## 2018-10-22 DIAGNOSIS — N3281 Overactive bladder: Secondary | ICD-10-CM | POA: Diagnosis not present

## 2018-10-22 DIAGNOSIS — C91Z0 Other lymphoid leukemia not having achieved remission: Secondary | ICD-10-CM | POA: Diagnosis not present

## 2018-10-22 DIAGNOSIS — K219 Gastro-esophageal reflux disease without esophagitis: Secondary | ICD-10-CM | POA: Diagnosis not present

## 2018-10-22 DIAGNOSIS — D709 Neutropenia, unspecified: Secondary | ICD-10-CM | POA: Diagnosis not present

## 2018-10-22 DIAGNOSIS — Z452 Encounter for adjustment and management of vascular access device: Secondary | ICD-10-CM | POA: Diagnosis not present

## 2018-10-22 DIAGNOSIS — K5190 Ulcerative colitis, unspecified, without complications: Secondary | ICD-10-CM | POA: Diagnosis not present

## 2018-10-22 DIAGNOSIS — R509 Fever, unspecified: Secondary | ICD-10-CM | POA: Diagnosis not present

## 2018-10-22 DIAGNOSIS — D696 Thrombocytopenia, unspecified: Secondary | ICD-10-CM | POA: Diagnosis not present

## 2018-10-22 DIAGNOSIS — B9620 Unspecified Escherichia coli [E. coli] as the cause of diseases classified elsewhere: Secondary | ICD-10-CM | POA: Diagnosis not present

## 2018-10-22 DIAGNOSIS — H4310 Vitreous hemorrhage, unspecified eye: Secondary | ICD-10-CM | POA: Diagnosis not present

## 2018-10-22 DIAGNOSIS — Z20828 Contact with and (suspected) exposure to other viral communicable diseases: Secondary | ICD-10-CM | POA: Diagnosis not present

## 2018-10-22 DIAGNOSIS — Z79899 Other long term (current) drug therapy: Secondary | ICD-10-CM | POA: Diagnosis not present

## 2018-10-22 DIAGNOSIS — H4312 Vitreous hemorrhage, left eye: Secondary | ICD-10-CM | POA: Diagnosis not present

## 2018-10-22 DIAGNOSIS — I5020 Unspecified systolic (congestive) heart failure: Secondary | ICD-10-CM | POA: Diagnosis not present

## 2018-10-22 DIAGNOSIS — D689 Coagulation defect, unspecified: Secondary | ICD-10-CM | POA: Diagnosis not present

## 2018-10-22 DIAGNOSIS — I110 Hypertensive heart disease with heart failure: Secondary | ICD-10-CM | POA: Diagnosis not present

## 2018-10-22 DIAGNOSIS — C9100 Acute lymphoblastic leukemia not having achieved remission: Secondary | ICD-10-CM | POA: Diagnosis not present

## 2018-10-22 DIAGNOSIS — F329 Major depressive disorder, single episode, unspecified: Secondary | ICD-10-CM | POA: Diagnosis not present

## 2018-10-22 DIAGNOSIS — R791 Abnormal coagulation profile: Secondary | ICD-10-CM | POA: Diagnosis not present

## 2018-10-22 DIAGNOSIS — J4530 Mild persistent asthma, uncomplicated: Secondary | ICD-10-CM | POA: Diagnosis not present

## 2018-10-22 DIAGNOSIS — R279 Unspecified lack of coordination: Secondary | ICD-10-CM | POA: Diagnosis not present

## 2018-10-22 DIAGNOSIS — Z743 Need for continuous supervision: Secondary | ICD-10-CM | POA: Diagnosis not present

## 2018-10-22 DIAGNOSIS — E039 Hypothyroidism, unspecified: Secondary | ICD-10-CM | POA: Diagnosis not present

## 2018-10-22 DIAGNOSIS — I5022 Chronic systolic (congestive) heart failure: Secondary | ICD-10-CM | POA: Diagnosis not present

## 2018-10-22 DIAGNOSIS — I10 Essential (primary) hypertension: Secondary | ICD-10-CM | POA: Diagnosis not present

## 2018-10-22 DIAGNOSIS — Z95810 Presence of automatic (implantable) cardiac defibrillator: Secondary | ICD-10-CM | POA: Diagnosis not present

## 2018-10-22 DIAGNOSIS — E876 Hypokalemia: Secondary | ICD-10-CM | POA: Diagnosis not present

## 2018-10-22 DIAGNOSIS — D61818 Other pancytopenia: Secondary | ICD-10-CM | POA: Diagnosis not present

## 2018-10-22 DIAGNOSIS — E785 Hyperlipidemia, unspecified: Secondary | ICD-10-CM | POA: Diagnosis not present

## 2018-10-22 DIAGNOSIS — I420 Dilated cardiomyopathy: Secondary | ICD-10-CM | POA: Diagnosis not present

## 2018-10-22 DIAGNOSIS — E034 Atrophy of thyroid (acquired): Secondary | ICD-10-CM | POA: Diagnosis not present

## 2018-10-22 DIAGNOSIS — R5381 Other malaise: Secondary | ICD-10-CM | POA: Diagnosis not present

## 2018-10-22 DIAGNOSIS — M170 Bilateral primary osteoarthritis of knee: Secondary | ICD-10-CM | POA: Diagnosis not present

## 2018-10-23 DIAGNOSIS — I5020 Unspecified systolic (congestive) heart failure: Secondary | ICD-10-CM | POA: Diagnosis not present

## 2018-10-23 DIAGNOSIS — I10 Essential (primary) hypertension: Secondary | ICD-10-CM | POA: Diagnosis not present

## 2018-10-23 DIAGNOSIS — H4310 Vitreous hemorrhage, unspecified eye: Secondary | ICD-10-CM | POA: Diagnosis not present

## 2018-10-23 DIAGNOSIS — C9100 Acute lymphoblastic leukemia not having achieved remission: Secondary | ICD-10-CM | POA: Diagnosis not present

## 2018-10-25 DIAGNOSIS — I10 Essential (primary) hypertension: Secondary | ICD-10-CM | POA: Diagnosis not present

## 2018-10-25 DIAGNOSIS — E034 Atrophy of thyroid (acquired): Secondary | ICD-10-CM | POA: Diagnosis not present

## 2018-10-25 DIAGNOSIS — C9100 Acute lymphoblastic leukemia not having achieved remission: Secondary | ICD-10-CM | POA: Diagnosis not present

## 2018-10-25 DIAGNOSIS — I5020 Unspecified systolic (congestive) heart failure: Secondary | ICD-10-CM | POA: Diagnosis not present

## 2018-10-31 DIAGNOSIS — I110 Hypertensive heart disease with heart failure: Secondary | ICD-10-CM | POA: Diagnosis not present

## 2018-10-31 DIAGNOSIS — Z452 Encounter for adjustment and management of vascular access device: Secondary | ICD-10-CM | POA: Diagnosis not present

## 2018-10-31 DIAGNOSIS — I5022 Chronic systolic (congestive) heart failure: Secondary | ICD-10-CM | POA: Diagnosis not present

## 2018-10-31 DIAGNOSIS — C91Z0 Other lymphoid leukemia not having achieved remission: Secondary | ICD-10-CM | POA: Diagnosis not present

## 2018-10-31 DIAGNOSIS — R791 Abnormal coagulation profile: Secondary | ICD-10-CM | POA: Diagnosis not present

## 2018-10-31 DIAGNOSIS — Z79899 Other long term (current) drug therapy: Secondary | ICD-10-CM | POA: Diagnosis not present

## 2018-10-31 DIAGNOSIS — C9100 Acute lymphoblastic leukemia not having achieved remission: Secondary | ICD-10-CM | POA: Diagnosis not present

## 2018-10-31 DIAGNOSIS — E876 Hypokalemia: Secondary | ICD-10-CM | POA: Diagnosis not present

## 2018-11-06 DIAGNOSIS — C9590 Leukemia, unspecified not having achieved remission: Secondary | ICD-10-CM | POA: Diagnosis not present

## 2018-11-06 DIAGNOSIS — K219 Gastro-esophageal reflux disease without esophagitis: Secondary | ICD-10-CM | POA: Diagnosis not present

## 2018-11-07 DIAGNOSIS — C9102 Acute lymphoblastic leukemia, in relapse: Secondary | ICD-10-CM | POA: Diagnosis not present

## 2018-11-08 DIAGNOSIS — J069 Acute upper respiratory infection, unspecified: Secondary | ICD-10-CM | POA: Diagnosis not present

## 2018-11-11 DIAGNOSIS — C9100 Acute lymphoblastic leukemia not having achieved remission: Secondary | ICD-10-CM | POA: Diagnosis not present

## 2018-11-18 DIAGNOSIS — J069 Acute upper respiratory infection, unspecified: Secondary | ICD-10-CM | POA: Diagnosis not present

## 2018-11-25 DIAGNOSIS — J069 Acute upper respiratory infection, unspecified: Secondary | ICD-10-CM | POA: Diagnosis not present

## 2018-11-26 DIAGNOSIS — C9102 Acute lymphoblastic leukemia, in relapse: Secondary | ICD-10-CM | POA: Diagnosis not present

## 2018-12-03 DIAGNOSIS — I10 Essential (primary) hypertension: Secondary | ICD-10-CM | POA: Diagnosis not present

## 2018-12-03 DIAGNOSIS — K219 Gastro-esophageal reflux disease without esophagitis: Secondary | ICD-10-CM | POA: Diagnosis not present

## 2018-12-03 DIAGNOSIS — I360 Nonrheumatic tricuspid (valve) stenosis: Secondary | ICD-10-CM | POA: Diagnosis not present

## 2018-12-09 DIAGNOSIS — C9102 Acute lymphoblastic leukemia, in relapse: Secondary | ICD-10-CM | POA: Diagnosis not present

## 2018-12-09 DIAGNOSIS — R791 Abnormal coagulation profile: Secondary | ICD-10-CM | POA: Diagnosis not present

## 2018-12-11 DIAGNOSIS — I119 Hypertensive heart disease without heart failure: Secondary | ICD-10-CM | POA: Diagnosis not present

## 2018-12-11 DIAGNOSIS — C9590 Leukemia, unspecified not having achieved remission: Secondary | ICD-10-CM | POA: Diagnosis not present

## 2018-12-11 DIAGNOSIS — K219 Gastro-esophageal reflux disease without esophagitis: Secondary | ICD-10-CM | POA: Diagnosis not present

## 2018-12-17 DIAGNOSIS — D65 Disseminated intravascular coagulation [defibrination syndrome]: Secondary | ICD-10-CM | POA: Diagnosis not present

## 2018-12-17 DIAGNOSIS — C9100 Acute lymphoblastic leukemia not having achieved remission: Secondary | ICD-10-CM | POA: Diagnosis not present

## 2018-12-24 ENCOUNTER — Telehealth: Payer: Self-pay

## 2018-12-24 ENCOUNTER — Other Ambulatory Visit: Payer: Self-pay

## 2018-12-24 MED ORDER — AMLODIPINE BESYLATE 2.5 MG PO TABS: 2.5000 mg | ORAL_TABLET | Freq: Every day | ORAL | 2 refills | Status: DC

## 2018-12-25 NOTE — Telephone Encounter (Signed)
RX sent

## 2019-01-06 LAB — HIV ANTIBODY (ROUTINE TESTING W REFLEX): HIV Screen 4th Generation wRfx: NONREACTIVE

## 2019-01-17 DIAGNOSIS — R0602 Shortness of breath: Secondary | ICD-10-CM | POA: Diagnosis not present

## 2019-01-17 DIAGNOSIS — C9590 Leukemia, unspecified not having achieved remission: Secondary | ICD-10-CM | POA: Diagnosis not present

## 2019-01-17 DIAGNOSIS — K219 Gastro-esophageal reflux disease without esophagitis: Secondary | ICD-10-CM | POA: Diagnosis not present

## 2019-01-20 DIAGNOSIS — C9100 Acute lymphoblastic leukemia not having achieved remission: Secondary | ICD-10-CM | POA: Diagnosis not present

## 2019-01-24 DIAGNOSIS — C9100 Acute lymphoblastic leukemia not having achieved remission: Secondary | ICD-10-CM | POA: Diagnosis not present

## 2019-01-24 DIAGNOSIS — D65 Disseminated intravascular coagulation [defibrination syndrome]: Secondary | ICD-10-CM | POA: Diagnosis not present

## 2019-02-05 DIAGNOSIS — C9100 Acute lymphoblastic leukemia not having achieved remission: Secondary | ICD-10-CM | POA: Diagnosis not present

## 2019-02-05 DIAGNOSIS — Z856 Personal history of leukemia: Secondary | ICD-10-CM | POA: Diagnosis not present

## 2019-02-05 DIAGNOSIS — D65 Disseminated intravascular coagulation [defibrination syndrome]: Secondary | ICD-10-CM | POA: Diagnosis not present

## 2019-02-05 DIAGNOSIS — Z1379 Encounter for other screening for genetic and chromosomal anomalies: Secondary | ICD-10-CM | POA: Diagnosis not present

## 2019-02-19 DIAGNOSIS — C9100 Acute lymphoblastic leukemia not having achieved remission: Secondary | ICD-10-CM | POA: Diagnosis not present

## 2019-02-19 DIAGNOSIS — D65 Disseminated intravascular coagulation [defibrination syndrome]: Secondary | ICD-10-CM | POA: Diagnosis not present

## 2019-02-21 DIAGNOSIS — Z23 Encounter for immunization: Secondary | ICD-10-CM | POA: Diagnosis not present

## 2019-03-07 DIAGNOSIS — C9590 Leukemia, unspecified not having achieved remission: Secondary | ICD-10-CM | POA: Diagnosis not present

## 2019-03-07 DIAGNOSIS — K219 Gastro-esophageal reflux disease without esophagitis: Secondary | ICD-10-CM | POA: Diagnosis not present

## 2019-03-07 DIAGNOSIS — R0602 Shortness of breath: Secondary | ICD-10-CM | POA: Diagnosis not present

## 2019-03-10 DIAGNOSIS — C9590 Leukemia, unspecified not having achieved remission: Secondary | ICD-10-CM | POA: Diagnosis not present

## 2019-03-10 DIAGNOSIS — R0602 Shortness of breath: Secondary | ICD-10-CM | POA: Diagnosis not present

## 2019-03-10 DIAGNOSIS — J90 Pleural effusion, not elsewhere classified: Secondary | ICD-10-CM | POA: Diagnosis not present

## 2019-03-19 DIAGNOSIS — D65 Disseminated intravascular coagulation [defibrination syndrome]: Secondary | ICD-10-CM | POA: Diagnosis not present

## 2019-03-19 DIAGNOSIS — R600 Localized edema: Secondary | ICD-10-CM | POA: Diagnosis not present

## 2019-03-19 DIAGNOSIS — I360 Nonrheumatic tricuspid (valve) stenosis: Secondary | ICD-10-CM | POA: Diagnosis not present

## 2019-03-19 DIAGNOSIS — Z1379 Encounter for other screening for genetic and chromosomal anomalies: Secondary | ICD-10-CM | POA: Diagnosis not present

## 2019-03-19 DIAGNOSIS — R0602 Shortness of breath: Secondary | ICD-10-CM | POA: Diagnosis not present

## 2019-03-19 DIAGNOSIS — C9100 Acute lymphoblastic leukemia not having achieved remission: Secondary | ICD-10-CM | POA: Diagnosis not present

## 2019-03-19 DIAGNOSIS — Q998 Other specified chromosome abnormalities: Secondary | ICD-10-CM | POA: Diagnosis not present

## 2019-03-19 DIAGNOSIS — I509 Heart failure, unspecified: Secondary | ICD-10-CM | POA: Diagnosis not present

## 2019-03-21 DIAGNOSIS — K219 Gastro-esophageal reflux disease without esophagitis: Secondary | ICD-10-CM | POA: Diagnosis not present

## 2019-03-21 DIAGNOSIS — J90 Pleural effusion, not elsewhere classified: Secondary | ICD-10-CM | POA: Diagnosis not present

## 2019-03-21 DIAGNOSIS — C9590 Leukemia, unspecified not having achieved remission: Secondary | ICD-10-CM | POA: Diagnosis not present

## 2019-03-21 DIAGNOSIS — R0602 Shortness of breath: Secondary | ICD-10-CM | POA: Diagnosis not present

## 2019-03-21 DIAGNOSIS — I509 Heart failure, unspecified: Secondary | ICD-10-CM | POA: Diagnosis not present

## 2019-03-24 DIAGNOSIS — R0602 Shortness of breath: Secondary | ICD-10-CM | POA: Diagnosis not present

## 2019-03-24 DIAGNOSIS — C9590 Leukemia, unspecified not having achieved remission: Secondary | ICD-10-CM | POA: Diagnosis not present

## 2019-03-24 DIAGNOSIS — J90 Pleural effusion, not elsewhere classified: Secondary | ICD-10-CM | POA: Diagnosis not present

## 2019-04-07 DIAGNOSIS — C9100 Acute lymphoblastic leukemia not having achieved remission: Secondary | ICD-10-CM | POA: Diagnosis not present

## 2019-04-16 DIAGNOSIS — R5383 Other fatigue: Secondary | ICD-10-CM | POA: Diagnosis not present

## 2019-04-16 DIAGNOSIS — D65 Disseminated intravascular coagulation [defibrination syndrome]: Secondary | ICD-10-CM | POA: Diagnosis not present

## 2019-04-16 DIAGNOSIS — C9100 Acute lymphoblastic leukemia not having achieved remission: Secondary | ICD-10-CM | POA: Diagnosis not present

## 2019-04-16 DIAGNOSIS — R0602 Shortness of breath: Secondary | ICD-10-CM | POA: Diagnosis not present

## 2019-04-16 DIAGNOSIS — I509 Heart failure, unspecified: Secondary | ICD-10-CM | POA: Diagnosis not present

## 2019-04-16 DIAGNOSIS — R600 Localized edema: Secondary | ICD-10-CM | POA: Diagnosis not present

## 2019-04-22 DIAGNOSIS — K219 Gastro-esophageal reflux disease without esophagitis: Secondary | ICD-10-CM | POA: Diagnosis not present

## 2019-04-22 DIAGNOSIS — I119 Hypertensive heart disease without heart failure: Secondary | ICD-10-CM | POA: Diagnosis not present

## 2019-04-22 DIAGNOSIS — C9590 Leukemia, unspecified not having achieved remission: Secondary | ICD-10-CM | POA: Diagnosis not present

## 2019-04-23 DIAGNOSIS — C9590 Leukemia, unspecified not having achieved remission: Secondary | ICD-10-CM | POA: Diagnosis not present

## 2019-04-23 DIAGNOSIS — R0602 Shortness of breath: Secondary | ICD-10-CM | POA: Diagnosis not present

## 2019-04-23 DIAGNOSIS — J90 Pleural effusion, not elsewhere classified: Secondary | ICD-10-CM | POA: Diagnosis not present

## 2019-04-29 DIAGNOSIS — C9100 Acute lymphoblastic leukemia not having achieved remission: Secondary | ICD-10-CM | POA: Diagnosis not present

## 2019-06-19 DIAGNOSIS — I119 Hypertensive heart disease without heart failure: Secondary | ICD-10-CM | POA: Diagnosis not present

## 2019-06-19 DIAGNOSIS — K219 Gastro-esophageal reflux disease without esophagitis: Secondary | ICD-10-CM | POA: Diagnosis not present

## 2019-06-19 DIAGNOSIS — C9590 Leukemia, unspecified not having achieved remission: Secondary | ICD-10-CM | POA: Diagnosis not present

## 2019-06-21 DIAGNOSIS — Z23 Encounter for immunization: Secondary | ICD-10-CM | POA: Diagnosis not present

## 2019-07-11 DIAGNOSIS — Z23 Encounter for immunization: Secondary | ICD-10-CM | POA: Diagnosis not present

## 2019-07-16 DIAGNOSIS — E039 Hypothyroidism, unspecified: Secondary | ICD-10-CM | POA: Diagnosis not present

## 2019-07-16 DIAGNOSIS — C9100 Acute lymphoblastic leukemia not having achieved remission: Secondary | ICD-10-CM | POA: Diagnosis not present

## 2019-07-16 DIAGNOSIS — R5383 Other fatigue: Secondary | ICD-10-CM | POA: Diagnosis not present

## 2019-07-23 DIAGNOSIS — C9100 Acute lymphoblastic leukemia not having achieved remission: Secondary | ICD-10-CM | POA: Diagnosis not present

## 2019-07-23 DIAGNOSIS — K219 Gastro-esophageal reflux disease without esophagitis: Secondary | ICD-10-CM | POA: Diagnosis not present

## 2019-07-23 DIAGNOSIS — R1013 Epigastric pain: Secondary | ICD-10-CM | POA: Diagnosis not present

## 2019-07-23 DIAGNOSIS — K5190 Ulcerative colitis, unspecified, without complications: Secondary | ICD-10-CM | POA: Diagnosis not present

## 2019-07-23 DIAGNOSIS — Z0000 Encounter for general adult medical examination without abnormal findings: Secondary | ICD-10-CM | POA: Diagnosis not present

## 2019-07-25 DIAGNOSIS — K5190 Ulcerative colitis, unspecified, without complications: Secondary | ICD-10-CM | POA: Diagnosis not present

## 2019-07-25 DIAGNOSIS — K219 Gastro-esophageal reflux disease without esophagitis: Secondary | ICD-10-CM | POA: Diagnosis not present

## 2019-07-25 DIAGNOSIS — Z0000 Encounter for general adult medical examination without abnormal findings: Secondary | ICD-10-CM | POA: Diagnosis not present

## 2019-07-25 DIAGNOSIS — C9100 Acute lymphoblastic leukemia not having achieved remission: Secondary | ICD-10-CM | POA: Diagnosis not present

## 2019-07-25 DIAGNOSIS — R1013 Epigastric pain: Secondary | ICD-10-CM | POA: Diagnosis not present

## 2019-07-28 DIAGNOSIS — C9590 Leukemia, unspecified not having achieved remission: Secondary | ICD-10-CM | POA: Diagnosis not present

## 2019-07-28 DIAGNOSIS — J90 Pleural effusion, not elsewhere classified: Secondary | ICD-10-CM | POA: Diagnosis not present

## 2019-07-28 DIAGNOSIS — R0602 Shortness of breath: Secondary | ICD-10-CM | POA: Diagnosis not present

## 2019-07-29 DIAGNOSIS — K219 Gastro-esophageal reflux disease without esophagitis: Secondary | ICD-10-CM | POA: Diagnosis not present

## 2019-07-29 DIAGNOSIS — C9590 Leukemia, unspecified not having achieved remission: Secondary | ICD-10-CM | POA: Diagnosis not present

## 2019-07-29 DIAGNOSIS — J309 Allergic rhinitis, unspecified: Secondary | ICD-10-CM | POA: Diagnosis not present

## 2019-08-20 DIAGNOSIS — C9100 Acute lymphoblastic leukemia not having achieved remission: Secondary | ICD-10-CM | POA: Diagnosis not present

## 2019-08-20 DIAGNOSIS — Z1379 Encounter for other screening for genetic and chromosomal anomalies: Secondary | ICD-10-CM | POA: Diagnosis not present

## 2019-08-25 DIAGNOSIS — C9100 Acute lymphoblastic leukemia not having achieved remission: Secondary | ICD-10-CM | POA: Diagnosis not present

## 2019-08-25 DIAGNOSIS — R1013 Epigastric pain: Secondary | ICD-10-CM | POA: Diagnosis not present

## 2019-08-25 DIAGNOSIS — Z0000 Encounter for general adult medical examination without abnormal findings: Secondary | ICD-10-CM | POA: Diagnosis not present

## 2019-08-25 DIAGNOSIS — K219 Gastro-esophageal reflux disease without esophagitis: Secondary | ICD-10-CM | POA: Diagnosis not present

## 2019-08-25 DIAGNOSIS — K5190 Ulcerative colitis, unspecified, without complications: Secondary | ICD-10-CM | POA: Diagnosis not present

## 2019-08-27 DIAGNOSIS — I119 Hypertensive heart disease without heart failure: Secondary | ICD-10-CM | POA: Diagnosis not present

## 2019-08-27 DIAGNOSIS — K219 Gastro-esophageal reflux disease without esophagitis: Secondary | ICD-10-CM | POA: Diagnosis not present

## 2019-08-27 DIAGNOSIS — C9590 Leukemia, unspecified not having achieved remission: Secondary | ICD-10-CM | POA: Diagnosis not present

## 2019-09-04 DIAGNOSIS — H26493 Other secondary cataract, bilateral: Secondary | ICD-10-CM | POA: Diagnosis not present

## 2019-09-04 DIAGNOSIS — H02834 Dermatochalasis of left upper eyelid: Secondary | ICD-10-CM | POA: Diagnosis not present

## 2019-09-04 DIAGNOSIS — M3501 Sicca syndrome with keratoconjunctivitis: Secondary | ICD-10-CM | POA: Diagnosis not present

## 2019-09-04 DIAGNOSIS — H43813 Vitreous degeneration, bilateral: Secondary | ICD-10-CM | POA: Diagnosis not present

## 2019-09-04 DIAGNOSIS — H524 Presbyopia: Secondary | ICD-10-CM | POA: Diagnosis not present

## 2019-09-04 DIAGNOSIS — H02831 Dermatochalasis of right upper eyelid: Secondary | ICD-10-CM | POA: Diagnosis not present

## 2019-09-09 DIAGNOSIS — C9100 Acute lymphoblastic leukemia not having achieved remission: Secondary | ICD-10-CM | POA: Diagnosis not present

## 2019-09-29 DIAGNOSIS — K219 Gastro-esophageal reflux disease without esophagitis: Secondary | ICD-10-CM | POA: Diagnosis not present

## 2019-09-29 DIAGNOSIS — Z0000 Encounter for general adult medical examination without abnormal findings: Secondary | ICD-10-CM | POA: Diagnosis not present

## 2019-09-29 DIAGNOSIS — K5190 Ulcerative colitis, unspecified, without complications: Secondary | ICD-10-CM | POA: Diagnosis not present

## 2019-09-29 DIAGNOSIS — C9100 Acute lymphoblastic leukemia not having achieved remission: Secondary | ICD-10-CM | POA: Diagnosis not present

## 2019-09-29 DIAGNOSIS — R1011 Right upper quadrant pain: Secondary | ICD-10-CM | POA: Diagnosis not present

## 2019-10-07 DIAGNOSIS — C9100 Acute lymphoblastic leukemia not having achieved remission: Secondary | ICD-10-CM | POA: Diagnosis not present

## 2019-10-07 DIAGNOSIS — D45 Polycythemia vera: Secondary | ICD-10-CM | POA: Diagnosis not present

## 2019-10-22 DIAGNOSIS — H26491 Other secondary cataract, right eye: Secondary | ICD-10-CM | POA: Diagnosis not present

## 2019-10-30 DIAGNOSIS — R1011 Right upper quadrant pain: Secondary | ICD-10-CM | POA: Diagnosis not present

## 2019-10-30 DIAGNOSIS — K219 Gastro-esophageal reflux disease without esophagitis: Secondary | ICD-10-CM | POA: Diagnosis not present

## 2019-10-30 DIAGNOSIS — Z0000 Encounter for general adult medical examination without abnormal findings: Secondary | ICD-10-CM | POA: Diagnosis not present

## 2019-10-30 DIAGNOSIS — K5190 Ulcerative colitis, unspecified, without complications: Secondary | ICD-10-CM | POA: Diagnosis not present

## 2019-10-30 DIAGNOSIS — C9100 Acute lymphoblastic leukemia not having achieved remission: Secondary | ICD-10-CM | POA: Diagnosis not present

## 2019-11-05 DIAGNOSIS — H26492 Other secondary cataract, left eye: Secondary | ICD-10-CM | POA: Diagnosis not present

## 2019-11-11 DIAGNOSIS — H02834 Dermatochalasis of left upper eyelid: Secondary | ICD-10-CM | POA: Diagnosis not present

## 2019-11-11 DIAGNOSIS — H02831 Dermatochalasis of right upper eyelid: Secondary | ICD-10-CM | POA: Diagnosis not present

## 2019-12-20 DIAGNOSIS — R0602 Shortness of breath: Secondary | ICD-10-CM | POA: Diagnosis not present

## 2019-12-20 DIAGNOSIS — Z20822 Contact with and (suspected) exposure to covid-19: Secondary | ICD-10-CM | POA: Diagnosis not present

## 2019-12-20 DIAGNOSIS — R05 Cough: Secondary | ICD-10-CM | POA: Diagnosis not present

## 2019-12-20 DIAGNOSIS — I509 Heart failure, unspecified: Secondary | ICD-10-CM | POA: Diagnosis not present

## 2019-12-20 DIAGNOSIS — I10 Essential (primary) hypertension: Secondary | ICD-10-CM | POA: Diagnosis not present

## 2019-12-20 DIAGNOSIS — Z03818 Encounter for observation for suspected exposure to other biological agents ruled out: Secondary | ICD-10-CM | POA: Diagnosis not present

## 2019-12-20 DIAGNOSIS — C9590 Leukemia, unspecified not having achieved remission: Secondary | ICD-10-CM | POA: Diagnosis not present

## 2019-12-20 DIAGNOSIS — R509 Fever, unspecified: Secondary | ICD-10-CM | POA: Diagnosis not present

## 2019-12-20 DIAGNOSIS — J189 Pneumonia, unspecified organism: Secondary | ICD-10-CM | POA: Diagnosis not present

## 2020-01-08 DIAGNOSIS — C9000 Multiple myeloma not having achieved remission: Secondary | ICD-10-CM | POA: Diagnosis not present

## 2020-01-08 DIAGNOSIS — Z1379 Encounter for other screening for genetic and chromosomal anomalies: Secondary | ICD-10-CM | POA: Diagnosis not present

## 2020-01-08 DIAGNOSIS — E039 Hypothyroidism, unspecified: Secondary | ICD-10-CM | POA: Diagnosis not present

## 2020-01-08 DIAGNOSIS — D65 Disseminated intravascular coagulation [defibrination syndrome]: Secondary | ICD-10-CM | POA: Diagnosis not present

## 2020-01-08 DIAGNOSIS — I509 Heart failure, unspecified: Secondary | ICD-10-CM | POA: Diagnosis not present

## 2020-01-08 DIAGNOSIS — R5383 Other fatigue: Secondary | ICD-10-CM | POA: Diagnosis not present

## 2020-01-08 DIAGNOSIS — R0602 Shortness of breath: Secondary | ICD-10-CM | POA: Diagnosis not present

## 2020-01-08 DIAGNOSIS — R600 Localized edema: Secondary | ICD-10-CM | POA: Diagnosis not present

## 2020-01-08 DIAGNOSIS — C9100 Acute lymphoblastic leukemia not having achieved remission: Secondary | ICD-10-CM | POA: Diagnosis not present

## 2020-01-14 DIAGNOSIS — H02834 Dermatochalasis of left upper eyelid: Secondary | ICD-10-CM | POA: Diagnosis not present

## 2020-01-14 DIAGNOSIS — H02831 Dermatochalasis of right upper eyelid: Secondary | ICD-10-CM | POA: Diagnosis not present

## 2020-01-19 DIAGNOSIS — C9100 Acute lymphoblastic leukemia not having achieved remission: Secondary | ICD-10-CM | POA: Diagnosis not present

## 2020-02-02 DIAGNOSIS — Z23 Encounter for immunization: Secondary | ICD-10-CM | POA: Diagnosis not present

## 2020-02-04 DIAGNOSIS — Z23 Encounter for immunization: Secondary | ICD-10-CM | POA: Diagnosis not present

## 2020-02-22 DIAGNOSIS — Z23 Encounter for immunization: Secondary | ICD-10-CM | POA: Diagnosis not present

## 2020-03-09 DIAGNOSIS — I10 Essential (primary) hypertension: Secondary | ICD-10-CM | POA: Diagnosis not present

## 2020-03-09 DIAGNOSIS — E039 Hypothyroidism, unspecified: Secondary | ICD-10-CM | POA: Diagnosis not present

## 2020-03-09 DIAGNOSIS — C8590 Non-Hodgkin lymphoma, unspecified, unspecified site: Secondary | ICD-10-CM | POA: Diagnosis not present

## 2020-03-09 DIAGNOSIS — E785 Hyperlipidemia, unspecified: Secondary | ICD-10-CM | POA: Diagnosis not present

## 2020-03-09 DIAGNOSIS — I272 Pulmonary hypertension, unspecified: Secondary | ICD-10-CM | POA: Diagnosis not present

## 2020-03-09 DIAGNOSIS — R739 Hyperglycemia, unspecified: Secondary | ICD-10-CM | POA: Diagnosis not present

## 2020-03-19 DIAGNOSIS — H02831 Dermatochalasis of right upper eyelid: Secondary | ICD-10-CM | POA: Diagnosis not present

## 2020-03-19 DIAGNOSIS — H02834 Dermatochalasis of left upper eyelid: Secondary | ICD-10-CM | POA: Diagnosis not present

## 2020-04-07 DIAGNOSIS — E039 Hypothyroidism, unspecified: Secondary | ICD-10-CM | POA: Diagnosis not present

## 2020-04-07 DIAGNOSIS — C8590 Non-Hodgkin lymphoma, unspecified, unspecified site: Secondary | ICD-10-CM | POA: Diagnosis not present

## 2020-04-07 DIAGNOSIS — I10 Essential (primary) hypertension: Secondary | ICD-10-CM | POA: Diagnosis not present

## 2020-04-07 DIAGNOSIS — E785 Hyperlipidemia, unspecified: Secondary | ICD-10-CM | POA: Diagnosis not present

## 2020-04-07 DIAGNOSIS — I272 Pulmonary hypertension, unspecified: Secondary | ICD-10-CM | POA: Diagnosis not present

## 2020-04-19 DIAGNOSIS — J90 Pleural effusion, not elsewhere classified: Secondary | ICD-10-CM | POA: Diagnosis not present

## 2020-04-19 DIAGNOSIS — E039 Hypothyroidism, unspecified: Secondary | ICD-10-CM | POA: Diagnosis not present

## 2020-04-19 DIAGNOSIS — R0602 Shortness of breath: Secondary | ICD-10-CM | POA: Diagnosis not present

## 2020-04-19 DIAGNOSIS — I10 Essential (primary) hypertension: Secondary | ICD-10-CM | POA: Diagnosis not present

## 2020-04-19 DIAGNOSIS — C8590 Non-Hodgkin lymphoma, unspecified, unspecified site: Secondary | ICD-10-CM | POA: Diagnosis not present

## 2020-04-19 DIAGNOSIS — C9590 Leukemia, unspecified not having achieved remission: Secondary | ICD-10-CM | POA: Diagnosis not present

## 2020-05-05 DIAGNOSIS — D72810 Lymphocytopenia: Secondary | ICD-10-CM | POA: Diagnosis not present

## 2020-05-05 DIAGNOSIS — I509 Heart failure, unspecified: Secondary | ICD-10-CM | POA: Diagnosis not present

## 2020-05-05 DIAGNOSIS — R0602 Shortness of breath: Secondary | ICD-10-CM | POA: Diagnosis not present

## 2020-05-05 DIAGNOSIS — C9100 Acute lymphoblastic leukemia not having achieved remission: Secondary | ICD-10-CM | POA: Diagnosis not present

## 2020-05-05 DIAGNOSIS — R718 Other abnormality of red blood cells: Secondary | ICD-10-CM | POA: Diagnosis not present

## 2020-05-05 DIAGNOSIS — D65 Disseminated intravascular coagulation [defibrination syndrome]: Secondary | ICD-10-CM | POA: Diagnosis not present

## 2020-05-05 DIAGNOSIS — R5383 Other fatigue: Secondary | ICD-10-CM | POA: Diagnosis not present

## 2020-05-05 DIAGNOSIS — R600 Localized edema: Secondary | ICD-10-CM | POA: Diagnosis not present

## 2020-05-05 DIAGNOSIS — E039 Hypothyroidism, unspecified: Secondary | ICD-10-CM | POA: Diagnosis not present

## 2020-05-05 DIAGNOSIS — D539 Nutritional anemia, unspecified: Secondary | ICD-10-CM | POA: Diagnosis not present

## 2020-05-05 DIAGNOSIS — Z1379 Encounter for other screening for genetic and chromosomal anomalies: Secondary | ICD-10-CM | POA: Diagnosis not present

## 2020-05-17 DIAGNOSIS — C9100 Acute lymphoblastic leukemia not having achieved remission: Secondary | ICD-10-CM | POA: Diagnosis not present

## 2020-12-03 DEATH — deceased

## 2024-01-01 NOTE — Progress Notes (Signed)
 This encounter was created in error - please disregard.
